# Patient Record
Sex: Female | Born: 1935
Health system: Southern US, Community
[De-identification: ages and names within clinical notes are randomized; demographics above are authoritative.]

## PROBLEM LIST (undated history)

## (undated) DIAGNOSIS — Z923 Personal history of irradiation: Secondary | ICD-10-CM

## (undated) DIAGNOSIS — B029 Zoster without complications: Secondary | ICD-10-CM

## (undated) DIAGNOSIS — H1851 Endothelial corneal dystrophy: Secondary | ICD-10-CM

## (undated) DIAGNOSIS — H269 Unspecified cataract: Secondary | ICD-10-CM

## (undated) DIAGNOSIS — M8580 Other specified disorders of bone density and structure, unspecified site: Secondary | ICD-10-CM

## (undated) DIAGNOSIS — H18519 Endothelial corneal dystrophy, unspecified eye: Secondary | ICD-10-CM

## (undated) DIAGNOSIS — C50919 Malignant neoplasm of unspecified site of unspecified female breast: Secondary | ICD-10-CM

## (undated) DIAGNOSIS — E079 Disorder of thyroid, unspecified: Secondary | ICD-10-CM

## (undated) HISTORY — DX: Malignant neoplasm of unspecified site of unspecified female breast: C50.919

## (undated) HISTORY — DX: Other specified disorders of bone density and structure, unspecified site: M85.80

## (undated) HISTORY — DX: Disorder of thyroid, unspecified: E07.9

## (undated) HISTORY — DX: Endothelial corneal dystrophy, unspecified eye: H18.519

## (undated) HISTORY — DX: Endothelial corneal dystrophy: H18.51

## (undated) HISTORY — DX: Unspecified cataract: H26.9

## (undated) HISTORY — DX: Zoster without complications: B02.9

---
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
## 1991-05-30 HISTORY — PX: DILATION AND CURETTAGE, DIAGNOSTIC / THERAPEUTIC: SUR384

## 1998-03-18 ENCOUNTER — Other Ambulatory Visit: Admission: RE | Admit: 1998-03-18 | Discharge: 1998-03-18 | Payer: Self-pay | Admitting: Obstetrics and Gynecology

## 2002-03-24 ENCOUNTER — Other Ambulatory Visit: Admission: RE | Admit: 2002-03-24 | Discharge: 2002-03-24 | Payer: Self-pay | Admitting: Obstetrics & Gynecology

## 2004-03-15 ENCOUNTER — Other Ambulatory Visit: Admission: RE | Admit: 2004-03-15 | Discharge: 2004-03-15 | Payer: Self-pay | Admitting: Family Medicine

## 2004-03-15 ENCOUNTER — Ambulatory Visit: Payer: Self-pay | Admitting: Family Medicine

## 2004-04-07 ENCOUNTER — Ambulatory Visit: Payer: Self-pay | Admitting: Family Medicine

## 2005-04-12 ENCOUNTER — Ambulatory Visit: Payer: Self-pay | Admitting: Family Medicine

## 2006-04-05 ENCOUNTER — Ambulatory Visit: Payer: Self-pay | Admitting: Family Medicine

## 2006-09-17 ENCOUNTER — Ambulatory Visit: Payer: Self-pay | Admitting: Family Medicine

## 2006-12-03 ENCOUNTER — Ambulatory Visit: Payer: Self-pay | Admitting: Family Medicine

## 2006-12-03 ENCOUNTER — Other Ambulatory Visit: Admission: RE | Admit: 2006-12-03 | Discharge: 2006-12-03 | Payer: Self-pay | Admitting: Family Medicine

## 2006-12-03 ENCOUNTER — Encounter: Payer: Self-pay | Admitting: Family Medicine

## 2006-12-03 LAB — CONVERTED CEMR LAB
ALT: 18 units/L (ref 0–35)
AST: 23 units/L (ref 0–37)
Albumin: 3.6 g/dL (ref 3.5–5.2)
BUN: 9 mg/dL (ref 6–23)
Basophils Absolute: 0 10*3/uL (ref 0.0–0.1)
Calcium: 9.1 mg/dL (ref 8.4–10.5)
Chloride: 111 meq/L (ref 96–112)
Eosinophils Absolute: 0.2 10*3/uL (ref 0.0–0.6)
GFR calc non Af Amer: 105 mL/min
HDL: 53.1 mg/dL (ref >39.0)
MCHC: 32.8 g/dL (ref 30.0–36.0)
MCV: 89.9 fL (ref 78.0–100.0)
Platelets: 185 10*3/uL (ref 150–400)
RBC: 4.91 M/uL (ref 3.87–5.11)
Triglycerides: 157 mg/dL — ABNORMAL HIGH (ref 0–149)
WBC: 4.8 10*3/uL (ref 4.5–10.5)

## 2006-12-25 ENCOUNTER — Ambulatory Visit: Payer: Self-pay | Admitting: Gastroenterology

## 2007-01-08 ENCOUNTER — Encounter: Payer: Self-pay | Admitting: Family Medicine

## 2007-01-08 ENCOUNTER — Encounter: Payer: Self-pay | Admitting: Gastroenterology

## 2007-01-08 ENCOUNTER — Ambulatory Visit: Payer: Self-pay | Admitting: Gastroenterology

## 2007-03-08 ENCOUNTER — Ambulatory Visit: Payer: Self-pay | Admitting: Family Medicine

## 2008-03-03 ENCOUNTER — Ambulatory Visit: Payer: Self-pay | Admitting: Family Medicine

## 2008-11-11 ENCOUNTER — Ambulatory Visit: Payer: Self-pay | Admitting: Family Medicine

## 2008-11-11 DIAGNOSIS — M25519 Pain in unspecified shoulder: Secondary | ICD-10-CM

## 2009-03-04 ENCOUNTER — Ambulatory Visit: Payer: Self-pay | Admitting: Family Medicine

## 2009-05-29 DIAGNOSIS — C50919 Malignant neoplasm of unspecified site of unspecified female breast: Secondary | ICD-10-CM

## 2009-05-29 HISTORY — DX: Malignant neoplasm of unspecified site of unspecified female breast: C50.919

## 2009-05-29 HISTORY — PX: CHOLECYSTECTOMY: SHX55

## 2009-05-29 HISTORY — PX: BREAST LUMPECTOMY: SHX2

## 2009-06-04 ENCOUNTER — Ambulatory Visit: Payer: Self-pay | Admitting: Family Medicine

## 2009-06-04 DIAGNOSIS — N6459 Other signs and symptoms in breast: Secondary | ICD-10-CM

## 2009-06-07 LAB — CONVERTED CEMR LAB
AST: 22 units/L (ref 0–37)
Albumin: 3.6 g/dL (ref 3.5–5.2)
Alkaline Phosphatase: 52 units/L (ref 39–117)
Basophils Absolute: 0 10*3/uL (ref 0.0–0.1)
Bilirubin, Direct: 0 mg/dL (ref 0.0–0.3)
Calcium: 9.2 mg/dL (ref 8.4–10.5)
Creatinine, Ser: 0.6 mg/dL (ref 0.4–1.2)
GFR calc non Af Amer: 104.17 mL/min (ref >60)
Glucose, Bld: 91 mg/dL (ref 70–99)
HDL: 69.2 mg/dL (ref >39.00)
Hemoglobin: 14.2 g/dL (ref 12.0–15.0)
Lymphocytes Relative: 34.9 % (ref 12.0–46.0)
Monocytes Relative: 9.4 % (ref 3.0–12.0)
Neutro Abs: 2 10*3/uL (ref 1.4–7.7)
Neutrophils Relative %: 50.3 % (ref 43.0–77.0)
RBC: 4.66 M/uL (ref 3.87–5.11)
RDW: 11.6 % (ref 11.5–14.6)
Sodium: 143 meq/L (ref 135–145)
Total Bilirubin: 0.6 mg/dL (ref 0.3–1.2)
Total CHOL/HDL Ratio: 3
Triglycerides: 89 mg/dL (ref 0.0–149.0)

## 2009-06-15 ENCOUNTER — Encounter: Admission: RE | Admit: 2009-06-15 | Discharge: 2009-06-15 | Payer: Self-pay | Admitting: Family Medicine

## 2009-06-18 ENCOUNTER — Encounter: Admission: RE | Admit: 2009-06-18 | Discharge: 2009-06-18 | Payer: Self-pay | Admitting: Family Medicine

## 2009-06-18 LAB — HM MAMMOGRAPHY

## 2009-06-23 ENCOUNTER — Encounter: Payer: Self-pay | Admitting: Family Medicine

## 2009-06-28 ENCOUNTER — Encounter: Admission: RE | Admit: 2009-06-28 | Discharge: 2009-06-28 | Payer: Self-pay | Admitting: Family Medicine

## 2009-07-13 ENCOUNTER — Encounter: Admission: RE | Admit: 2009-07-13 | Discharge: 2009-07-13 | Payer: Self-pay | Admitting: General Surgery

## 2009-07-16 ENCOUNTER — Ambulatory Visit (HOSPITAL_BASED_OUTPATIENT_CLINIC_OR_DEPARTMENT_OTHER): Admission: RE | Admit: 2009-07-16 | Discharge: 2009-07-16 | Payer: Self-pay | Admitting: General Surgery

## 2009-07-16 ENCOUNTER — Encounter: Admission: RE | Admit: 2009-07-16 | Discharge: 2009-07-16 | Payer: Self-pay | Admitting: General Surgery

## 2009-07-28 ENCOUNTER — Ambulatory Visit: Payer: Self-pay | Admitting: Oncology

## 2009-08-17 ENCOUNTER — Ambulatory Visit
Admission: RE | Admit: 2009-08-17 | Discharge: 2009-10-11 | Payer: Self-pay | Source: Home / Self Care | Admitting: Radiation Oncology

## 2009-09-13 ENCOUNTER — Encounter: Payer: Self-pay | Admitting: Family Medicine

## 2009-10-08 ENCOUNTER — Encounter (HOSPITAL_COMMUNITY): Admission: RE | Admit: 2009-10-08 | Discharge: 2010-01-06 | Payer: Self-pay | Admitting: Oncology

## 2009-10-14 ENCOUNTER — Ambulatory Visit: Payer: Self-pay | Admitting: Oncology

## 2010-01-12 ENCOUNTER — Encounter: Admission: RE | Admit: 2010-01-12 | Discharge: 2010-01-12 | Payer: Self-pay | Admitting: General Surgery

## 2010-01-20 ENCOUNTER — Encounter: Admission: RE | Admit: 2010-01-20 | Discharge: 2010-01-20 | Payer: Self-pay | Admitting: General Surgery

## 2010-03-03 ENCOUNTER — Ambulatory Visit: Payer: Self-pay | Admitting: Family Medicine

## 2010-03-16 ENCOUNTER — Ambulatory Visit (HOSPITAL_COMMUNITY): Admission: RE | Admit: 2010-03-16 | Discharge: 2010-03-17 | Payer: Self-pay | Admitting: General Surgery

## 2010-03-16 ENCOUNTER — Encounter (INDEPENDENT_AMBULATORY_CARE_PROVIDER_SITE_OTHER): Payer: Self-pay | Admitting: General Surgery

## 2010-04-18 ENCOUNTER — Ambulatory Visit: Payer: Self-pay | Admitting: Oncology

## 2010-04-20 LAB — CBC WITH DIFFERENTIAL/PLATELET
Basophils Absolute: 0 10*3/uL (ref 0.0–0.1)
Eosinophils Absolute: 0.3 10*3/uL (ref 0.0–0.5)
HCT: 38.3 % (ref 34.8–46.6)
HGB: 13.4 g/dL (ref 11.6–15.9)
LYMPH%: 21.6 % (ref 14.0–49.7)
MCV: 90.4 fL (ref 79.5–101.0)
MONO#: 0.4 10*3/uL (ref 0.1–0.9)
MONO%: 9.3 % (ref 0.0–14.0)
NEUT#: 2.8 10*3/uL (ref 1.5–6.5)
NEUT%: 63.2 % (ref 38.4–76.8)
Platelets: 158 10*3/uL (ref 145–400)
WBC: 4.5 10*3/uL (ref 3.9–10.3)

## 2010-04-21 LAB — VITAMIN D 25 HYDROXY (VIT D DEFICIENCY, FRACTURES): Vit D, 25-Hydroxy: 47 ng/mL (ref 30–89)

## 2010-04-21 LAB — COMPREHENSIVE METABOLIC PANEL
Alkaline Phosphatase: 61 U/L (ref 39–117)
BUN: 11 mg/dL (ref 6–23)
CO2: 25 mEq/L (ref 19–32)
Glucose, Bld: 97 mg/dL (ref 70–99)
Total Bilirubin: 0.4 mg/dL (ref 0.3–1.2)

## 2010-04-21 LAB — LACTATE DEHYDROGENASE: LDH: 173 U/L (ref 94–250)

## 2010-04-21 LAB — CANCER ANTIGEN 27.29: CA 27.29: 9 U/mL (ref 0–39)

## 2010-04-27 ENCOUNTER — Encounter: Payer: Self-pay | Admitting: Family Medicine

## 2010-06-09 ENCOUNTER — Encounter
Admission: RE | Admit: 2010-06-09 | Discharge: 2010-06-09 | Payer: Self-pay | Source: Home / Self Care | Attending: Radiation Oncology | Admitting: Radiation Oncology

## 2010-06-30 ENCOUNTER — Ambulatory Visit: Payer: Self-pay | Admitting: Radiation Oncology

## 2010-06-30 ENCOUNTER — Ambulatory Visit: Payer: Medicare Other | Attending: Radiation Oncology | Admitting: Radiation Oncology

## 2010-06-30 NOTE — Letter (Signed)
Summary: [facility]  [facility]   Imported By: Lennie Odor 05/18/2010 17:15:45  _____________________________________________________________________  External Attachment:    Type:   Image     Comment:   External Document

## 2010-06-30 NOTE — Assessment & Plan Note (Signed)
Summary: flu shot/njr   Nurse Visit   Allergies: 1)  ! Penicillin V Potassium (Penicillin V Potassium) 2)  ! Dextroamphetamine Sulfate (Dextroamphetamine Sulfate)  Orders Added: 1)  Flu Vaccine 29yrs + MEDICARE PATIENTS [Q2039] 2)  Administration Flu vaccine - MCR [G0008] Flu Vaccine Consent Questions     Do you have a history of severe allergic reactions to this vaccine? no    Any prior history of allergic reactions to egg and/or gelatin? no    Do you have a sensitivity to the preservative Thimersol? no    Do you have a past history of Guillan-Barre Syndrome? no    Do you currently have an acute febrile illness? no    Have you ever had a severe reaction to latex? no    Vaccine information given and explained to patient? yes    Are you currently pregnant? no    Lot Number:[id number]   Exp Date:11/26/2010   Site Given  Left Deltoid IM Romualdo Bolk, CMA (AAMA)  March 03, 2010 1:39 PM

## 2010-06-30 NOTE — Assessment & Plan Note (Signed)
Summary: emp/pt fasting/cjr   Vital Signs:  Patient profile:   76 year old female Height:      62 inches Weight:      140 pounds BMI:     25.70 Temp:     98.5 degrees F oral BP sitting:   120 / 84  (left arm) Cuff size:   regular  Vitals Entered By: Kern Reap CMA Duncan Dull) (June 04, 2009 9:42 AM)  Reason for Visit cpx  History of Present Illness: Jessica Ashley is a 76 year old, married female, nonsmoker, who comes in today for physical examination  She is always been in excellent, health.  She's had no chronic health problems.  She brings in records from [facility].  Her DEXA scan shows mild osteopenia.  Her vitamin D level is 17.  She was given vitamin D by the nurse practitioner.  She gets routine eye care.  Dental care does BSE monthly, but does not get annual mammography.  When questioned why she states she just doesn't feel like she needs to go.  Explained to her why annual mammography is very important.   Colonoscopy done in GI normal 2008, seasonal flu shot 2010, tetanus, 2008, Pneumovax today.  Patient declines the shingles vac.   Preventive Screening-Counseling & Management  Alcohol-Tobacco     Smoking Status: never  Caffeine-Diet-Exercise     Does Patient Exercise: yes      Drug Use:  no.    Allergies: 1)  ! Penicillin V Potassium (Penicillin V Potassium) 2)  ! Dextroamphetamine Sulfate (Dextroamphetamine Sulfate)  Past History:  Past medical, surgical, family and social histories (including risk factors) reviewed, and no changes noted (except as noted below).  Family History: Reviewed history and no changes required.  Social History: Reviewed history and no changes required. Married Never Smoked Alcohol use-no Drug use-no Regular exercise-yes Smoking Status:  never Drug Use:  no Does Patient Exercise:  yes  Review of Systems      See HPI  Physical Exam  General:  Well-developed,well-nourished,in no acute distress; alert,appropriate and  cooperative throughout examination Head:  Normocephalic and atraumatic without obvious abnormalities. No apparent alopecia or balding. Eyes:  No corneal or conjunctival inflammation noted. EOMI. Perrla. Funduscopic exam benign, without hemorrhages, exudates or papilledema. Vision grossly normal. Ears:  External ear exam shows no significant lesions or deformities.  Otoscopic examination reveals clear canals, tympanic membranes are intact bilaterally without bulging, retraction, inflammation or discharge. Hearing is grossly normal bilaterally. Nose:  External nasal examination shows no deformity or inflammation. Nasal mucosa are pink and moist without lesions or exudates. Mouth:  Oral mucosa and oropharynx without lesions or exudates.  Teeth in good repair. Neck:  No deformities, masses, or tenderness noted. Chest Wall:  No deformities, masses, or tenderness noted. Breasts:  On inspection.  Both breasts appear normal except the left nipple is retracted in the supine position.  In the upright position but assumes a normal outward appearance.  She, states she's noticed this for a long time, nothing new.  No palpable masses Lungs:  Normal respiratory effort, chest expands symmetrically. Lungs are clear to auscultation, no crackles or wheezes. Heart:  Normal rate and regular rhythm. S1 and S2 normal without gallop, murmur, click, rub or other extra sounds. Abdomen:  Bowel sounds positive,abdomen soft and non-tender without masses, organomegaly or hernias noted. Msk:  No deformity or scoliosis noted of thoracic or lumbar spine.   Pulses:  R and L carotid,radial,femoral,dorsalis pedis and posterior tibial pulses are full and  equal bilaterally Extremities:  No clubbing, cyanosis, edema, or deformity noted with normal full range of motion of all joints.   Neurologic:  No cranial nerve deficits noted. Station and gait are normal. Plantar reflexes are down-going bilaterally. DTRs are symmetrical throughout.  Sensory, motor and coordinative functions appear intact. Skin:  Intact without suspicious lesions or rashes Cervical Nodes:  No lymphadenopathy noted Axillary Nodes:  No palpable lymphadenopathy Inguinal Nodes:  No significant adenopathy Psych:  Cognition and judgment appear intact. Alert and cooperative with normal attention span and concentration. No apparent delusions, illusions, hallucinations   Impression & Recommendations:  Problem # 1:  OTHER SIGN AND SYMPTOM IN BREAST (ICD-611.79) Assessment New  Orders: Venipuncture (16109) Prescription Created Electronically [phone]) UA Dipstick w/o Micro (automated)  (81003) TLB-Lipid Panel (80061-LIPID) TLB-BMP (Basic Metabolic Panel-BMET) (80048-METABOL) TLB-CBC Platelet - w/Differential (85025-CBCD) TLB-Hepatic/Liver Function Pnl (80076-HEPATIC) TLB-TSH (Thyroid Stimulating Hormone) (84443-TSH)  Problem # 2:  Preventive Health Care (ICD-V70.0) Assessment: Unchanged  Complete Medication List: 1)  Calcium, Mg, D3   Other Orders: EKG w/ Interpretation (93000) Pneumococcal Vaccine (09811) Admin 1st Vaccine (91478)  Patient Instructions: 1)  called the breast Center today and get set up for your mammogram next week. 2)  Continue to do a thorough breast exam monthly and I would recommend annual mammography 3)  Please schedule a follow-up appointment in 1 year. 4)  It is important that you exercise regularly at least 20 minutes 5 times a week. If you develop chest pain, have severe difficulty breathing, or feel very tired , stop exercising immediately and seek medical attention. 5)  Schedule your mammogram. 6)  Schedule a colonoscopy/sigmoidoscopy to help detect colon cancer. 7)  Take an Aspirin every day.   Immunization History:  Tetanus/Td Immunization History:    Tetanus/Td:  historical (05/29/2006)  Immunizations Administered:  Pneumonia Vaccine:    Vaccine Type: Pneumovax    Site: right deltoid    Mfr: Merck    Dose:  0.5 ml    Route: IM    Given by: Kern Reap CMA (AAMA)    Exp. Date: 06/24/2010    Lot #: [id number]    Physician counseled: yes

## 2010-07-22 ENCOUNTER — Ambulatory Visit: Payer: Self-pay | Admitting: Family Medicine

## 2010-08-10 LAB — COMPREHENSIVE METABOLIC PANEL
ALT: 17 U/L (ref 0–35)
AST: 21 U/L (ref 0–37)
Albumin: 3.5 g/dL (ref 3.5–5.2)
CO2: 31 mEq/L (ref 19–32)
Calcium: 9.3 mg/dL (ref 8.4–10.5)
Chloride: 105 mEq/L (ref 96–112)
Creatinine, Ser: 0.65 mg/dL (ref 0.4–1.2)
GFR calc Af Amer: >60 mL/min (ref >60)
GFR calc non Af Amer: >60 mL/min (ref >60)
Sodium: 142 mEq/L (ref 135–145)

## 2010-08-10 LAB — URINALYSIS, ROUTINE W REFLEX MICROSCOPIC
Bilirubin Urine: NEGATIVE
Glucose, UA: NEGATIVE mg/dL
Hgb urine dipstick: NEGATIVE
Ketones, ur: NEGATIVE mg/dL
Protein, ur: NEGATIVE mg/dL
pH: 8 (ref 5.0–8.0)

## 2010-08-10 LAB — CBC
Hemoglobin: 14.2 g/dL (ref 12.0–15.0)
MCH: 31.4 pg (ref 26.0–34.0)
MCHC: 34.6 g/dL (ref 30.0–36.0)
Platelets: 141 10*3/uL — ABNORMAL LOW (ref 150–400)
RBC: 4.52 MIL/uL (ref 3.87–5.11)

## 2010-08-10 LAB — DIFFERENTIAL
Eosinophils Absolute: 0.2 10*3/uL (ref 0.0–0.7)
Eosinophils Relative: 4 % (ref 0–5)
Lymphocytes Relative: 23 % (ref 12–46)
Lymphs Abs: 1 10*3/uL (ref 0.7–4.0)
Monocytes Absolute: 0.5 10*3/uL (ref 0.1–1.0)
Monocytes Relative: 11 % (ref 3–12)

## 2010-08-15 ENCOUNTER — Encounter: Payer: Self-pay | Admitting: Family Medicine

## 2010-08-16 ENCOUNTER — Encounter: Payer: Self-pay | Admitting: Family Medicine

## 2010-08-16 ENCOUNTER — Ambulatory Visit (INDEPENDENT_AMBULATORY_CARE_PROVIDER_SITE_OTHER): Payer: Medicare Other | Admitting: Family Medicine

## 2010-08-16 VITALS — BP 122/72 | HR 80 | Temp 98.4°F | Resp 12 | Ht 59.0 in | Wt 141.0 lb

## 2010-08-16 DIAGNOSIS — Z136 Encounter for screening for cardiovascular disorders: Secondary | ICD-10-CM

## 2010-08-16 DIAGNOSIS — C50912 Malignant neoplasm of unspecified site of left female breast: Secondary | ICD-10-CM | POA: Insufficient documentation

## 2010-08-16 DIAGNOSIS — E049 Nontoxic goiter, unspecified: Secondary | ICD-10-CM

## 2010-08-16 DIAGNOSIS — C50919 Malignant neoplasm of unspecified site of unspecified female breast: Secondary | ICD-10-CM

## 2010-08-16 DIAGNOSIS — Z0000 Encounter for general adult medical examination without abnormal findings: Secondary | ICD-10-CM

## 2010-08-16 LAB — POCT URINALYSIS DIPSTICK
Blood, UA: NEGATIVE
Nitrite, UA: NEGATIVE
Protein, UA: NEGATIVE
Spec Grav, UA: 1.02
Urobilinogen, UA: 0.2

## 2010-08-16 NOTE — Progress Notes (Signed)
  Subjective:    Patient ID: Jessica Ashley, female    DOB: 1934/10/30, 76 y.o.   MRN: [id number]  HPI Jessica Ashley is a delightful, 76 year old, married female, nonsmoker, who comes in today for general physical examination  Last year.  She had a mammogram, which was abnormal.  There was a lesion in her left breast at the 9 o'clock position.  Biopsy turned out cancer.  She underwent a surgical excision with postop radiation.  She is currently followed by Dr. Donnie Coffin.  Every 6 months in the cancer Center.  She is doing well.  No recurrence.       Review of Systems  Constitutional: Negative.   HENT: Negative.   Eyes: Negative.   Respiratory: Negative.   Cardiovascular: Negative.   Gastrointestinal: Negative.   Genitourinary: Negative.   Musculoskeletal: Negative.   Neurological: Negative.   Hematological: Negative.   Psychiatric/Behavioral: Negative.        Objective:   Physical Exam  Constitutional: She appears well-developed and well-nourished.  HENT:  Head: Normocephalic and atraumatic.  Right Ear: External ear normal.  Left Ear: External ear normal.  Nose: Nose normal.  Mouth/Throat: Oropharynx is clear and moist.  Eyes: EOM are normal. Pupils are equal, round, and reactive to light.  Neck: Normal range of motion. Neck supple. No thyromegaly present.  Cardiovascular: Normal rate, regular rhythm, normal heart sounds and intact distal pulses.  Exam reveals no gallop and no friction rub.   No murmur heard. Pulmonary/Chest: Effort normal and breath sounds normal.       Right breast normal left breast scar at the 9 o'clock position.  There is still some changes in that left breast from radiation treatment.  Otherwise, no palpable masses.  Scar in the axilla from lymph node biopsy  Abdominal: Soft. Bowel sounds are normal. She exhibits no distension and no mass. There is no tenderness. There is no rebound.       Scars in the abdomen from cholecystectomy.  Also, recent    Musculoskeletal: Normal range of motion.  Lymphadenopathy:    She has no cervical adenopathy.  Neurological: She is alert. She has normal reflexes. No cranial nerve deficit. She exhibits normal muscle tone. Coordination normal.  Skin: Skin is warm and dry.  Psychiatric: She has a normal mood and affect. Her behavior is normal. Judgment and thought content normal.          Assessment & Plan:  Status-post breast cancer........... Continue medication and follow-up with oncology.  Status post gallbladder removal.  Annual follow-up

## 2010-08-16 NOTE — Patient Instructions (Signed)
Continue your current medications from oncology.  Follow-up in one year, sooner if any problems

## 2010-08-18 LAB — CBC
Hemoglobin: 14.6 g/dL (ref 12.0–15.0)
RBC: 4.74 MIL/uL (ref 3.87–5.11)
RDW: 12.3 % (ref 11.5–15.5)
WBC: 4.5 10*3/uL (ref 4.0–10.5)

## 2010-08-18 LAB — COMPREHENSIVE METABOLIC PANEL
ALT: 19 U/L (ref 0–35)
Albumin: 3.6 g/dL (ref 3.5–5.2)
Alkaline Phosphatase: 59 U/L (ref 39–117)
Chloride: 103 mEq/L (ref 96–112)
Glucose, Bld: 87 mg/dL (ref 70–99)
Potassium: 4.7 mEq/L (ref 3.5–5.1)
Sodium: 140 mEq/L (ref 135–145)
Total Protein: 7.1 g/dL (ref 6.0–8.3)

## 2010-08-18 LAB — URINALYSIS, ROUTINE W REFLEX MICROSCOPIC
Bilirubin Urine: NEGATIVE
Glucose, UA: NEGATIVE mg/dL
Ketones, ur: NEGATIVE mg/dL
Protein, ur: NEGATIVE mg/dL
pH: 7 (ref 5.0–8.0)

## 2010-08-18 LAB — DIFFERENTIAL
Basophils Relative: 0 % (ref 0–1)
Eosinophils Absolute: 0.2 10*3/uL (ref 0.0–0.7)
Monocytes Absolute: 0.5 10*3/uL (ref 0.1–1.0)
Monocytes Relative: 10 % (ref 3–12)
Neutrophils Relative %: 51 % (ref 43–77)

## 2010-08-18 LAB — CANCER ANTIGEN 27.29: CA 27.29: 14 U/mL (ref 0–39)

## 2010-09-07 IMAGING — CR DG CHEST 2V
2 series · 2 of 2 positions shown · non-contrast
Comparison: None.

CLINICAL DATA: Preop left breast cancer.

CHEST - 2 VIEW

[w chest pa]
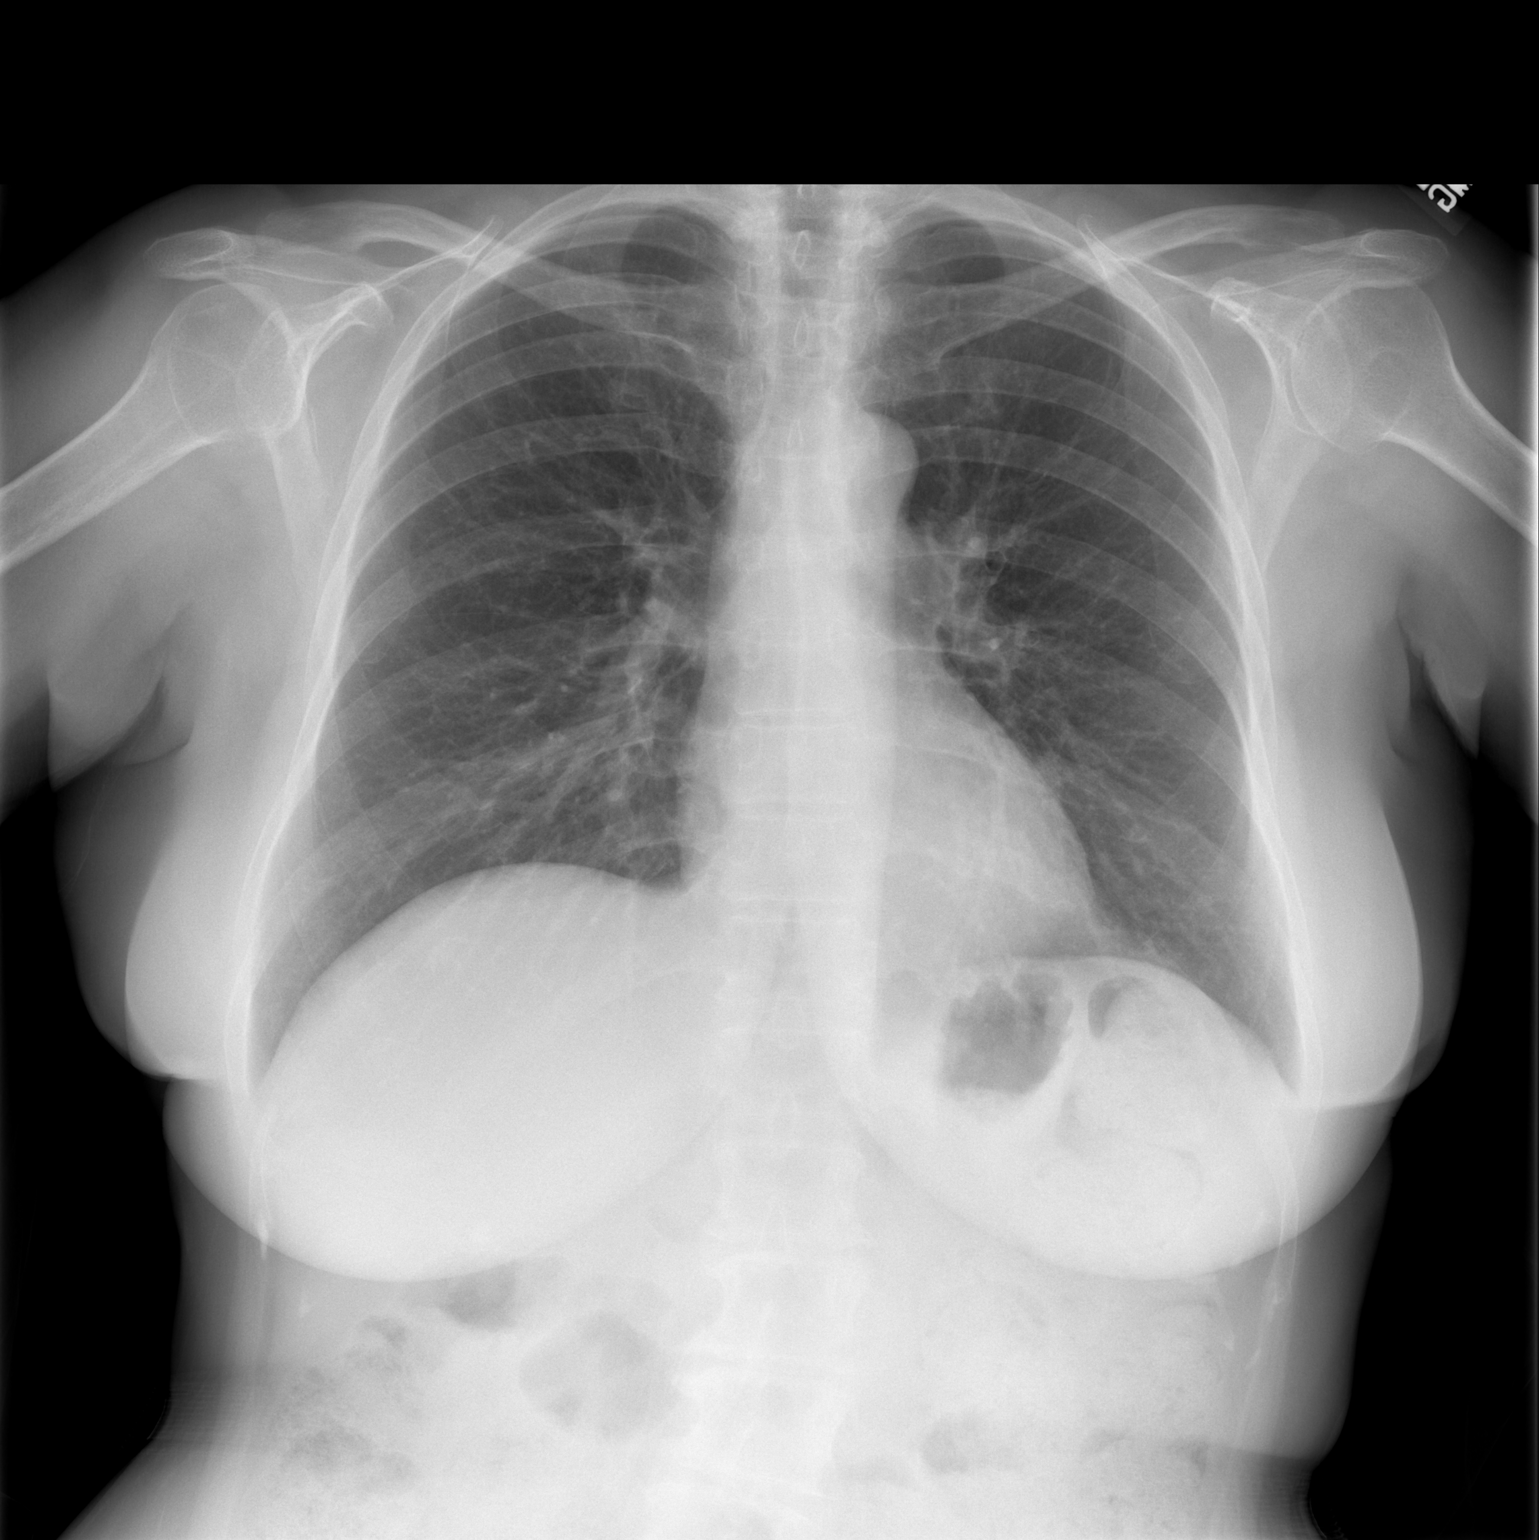

[w chest lat]
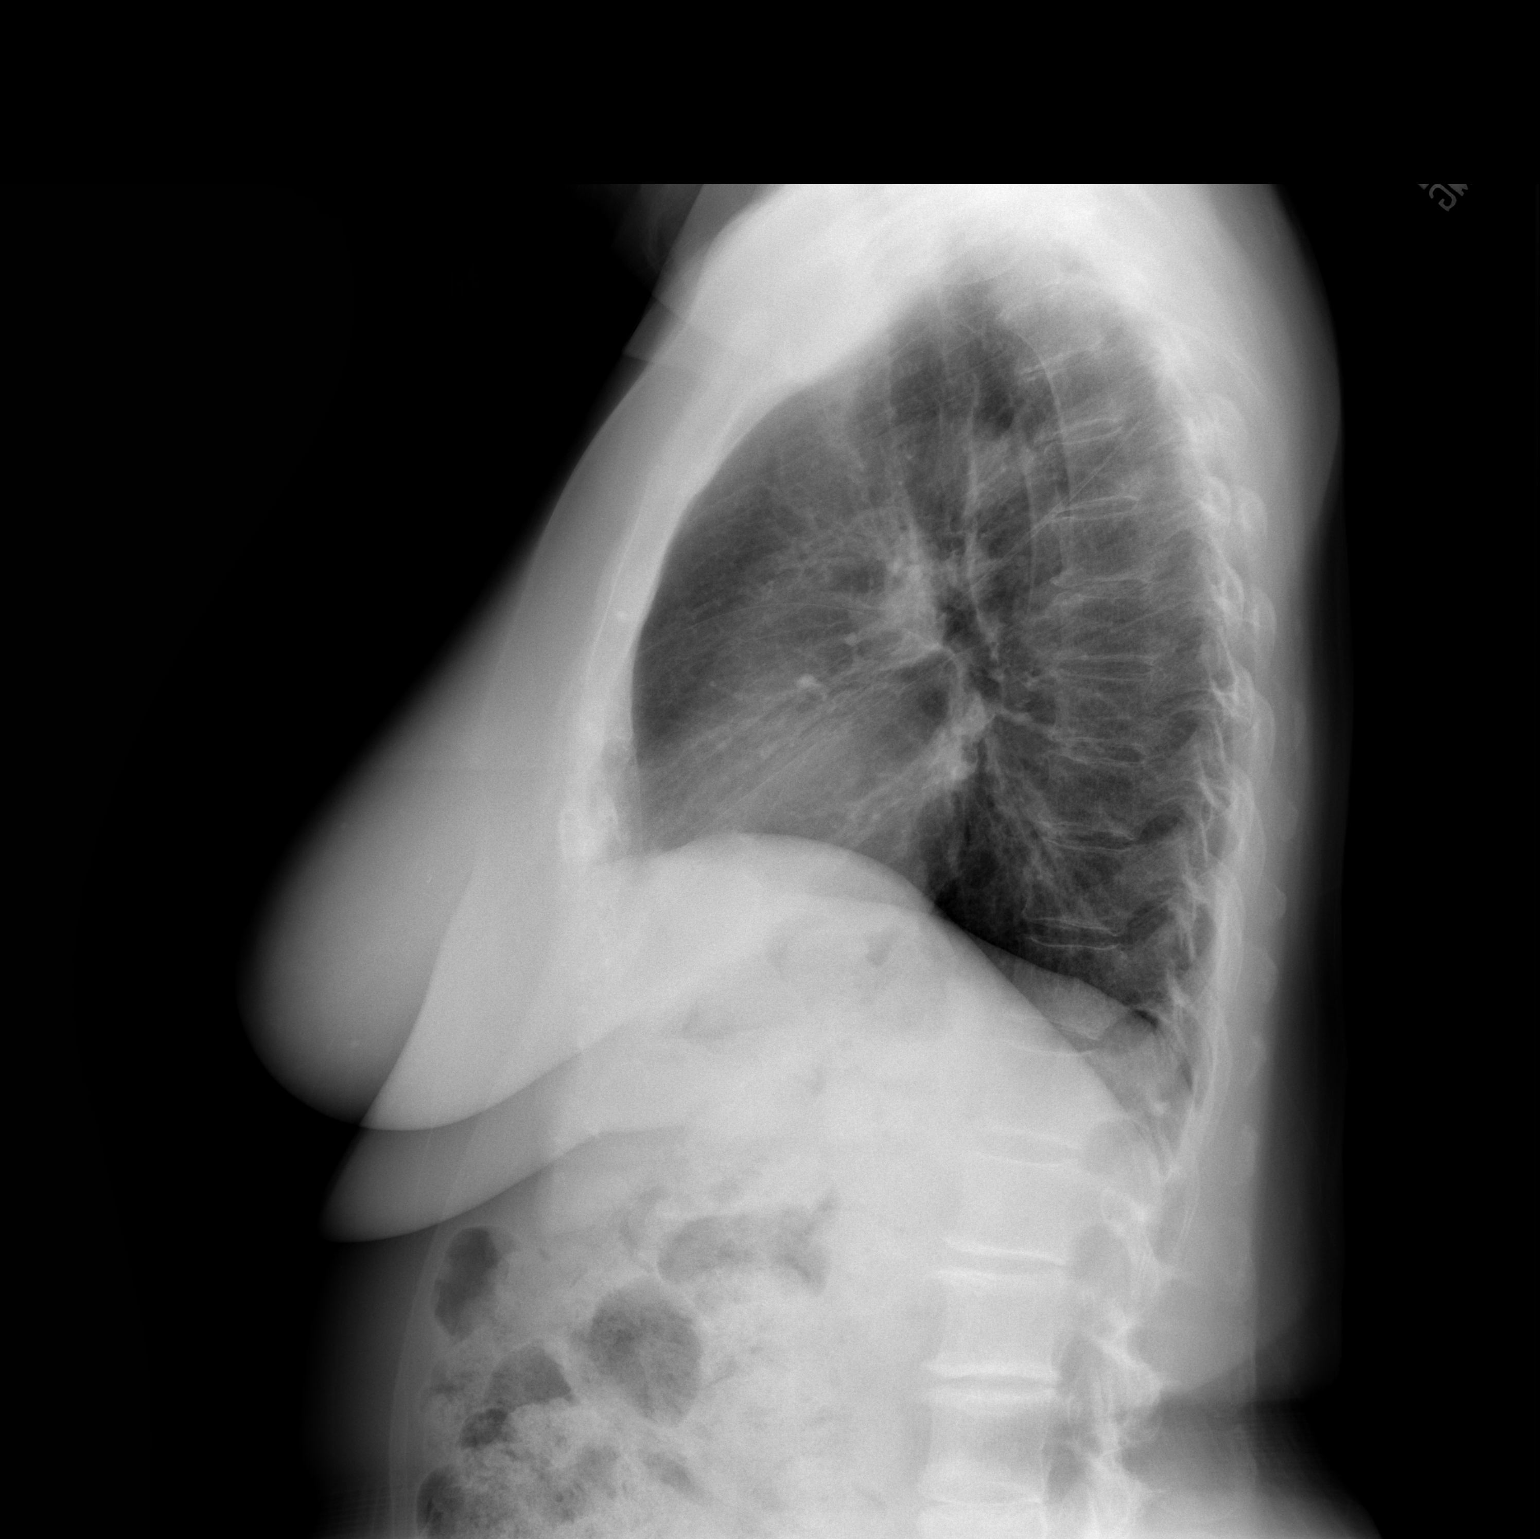

[2 of 2 positions shown; findings below may reference images not displayed]

FINDINGS: Generalized prominence bronchopulmonary markings of
slight asthma or bronchitis seen.  Lungs are otherwise clear.
Heart size is normal.  Mediastinum, hila, pleura and slight
degenerative changes in spine are otherwise unremarkable.
IMPRESSION: 1.  Slight perihilar asthma or bronchitis.
2.  No acute or metastatic disease findings.

## 2010-10-03 ENCOUNTER — Encounter (INDEPENDENT_AMBULATORY_CARE_PROVIDER_SITE_OTHER): Payer: Self-pay | Admitting: General Surgery

## 2010-10-18 ENCOUNTER — Encounter: Payer: Medicare Other | Admitting: Oncology

## 2010-10-18 ENCOUNTER — Other Ambulatory Visit: Payer: Self-pay | Admitting: Oncology

## 2010-10-18 LAB — CBC WITH DIFFERENTIAL/PLATELET
BASO%: 0.4 % (ref 0.0–2.0)
LYMPH%: 27.9 % (ref 14.0–49.7)
MCHC: 34.4 g/dL (ref 31.5–36.0)
MCV: 90.4 fL (ref 79.5–101.0)
MONO%: 11.9 % (ref 0.0–14.0)
Platelets: 144 10*3/uL — ABNORMAL LOW (ref 145–400)
RBC: 4.38 10*6/uL (ref 3.70–5.45)

## 2010-10-18 LAB — COMPREHENSIVE METABOLIC PANEL
ALT: 16 U/L (ref 0–35)
Alkaline Phosphatase: 70 U/L (ref 39–117)
Sodium: 143 mEq/L (ref 135–145)
Total Bilirubin: 0.4 mg/dL (ref 0.3–1.2)
Total Protein: 6.4 g/dL (ref 6.0–8.3)

## 2010-10-18 LAB — VITAMIN D 25 HYDROXY (VIT D DEFICIENCY, FRACTURES): Vit D, 25-Hydroxy: 55 ng/mL (ref 30–89)

## 2010-10-25 ENCOUNTER — Other Ambulatory Visit: Payer: Self-pay | Admitting: Oncology

## 2010-10-25 ENCOUNTER — Encounter (HOSPITAL_BASED_OUTPATIENT_CLINIC_OR_DEPARTMENT_OTHER): Payer: Medicare Other | Admitting: Oncology

## 2010-10-25 DIAGNOSIS — C50919 Malignant neoplasm of unspecified site of unspecified female breast: Secondary | ICD-10-CM

## 2010-10-25 DIAGNOSIS — Z853 Personal history of malignant neoplasm of breast: Secondary | ICD-10-CM

## 2010-10-25 DIAGNOSIS — Z170 Estrogen receptor positive status [ER+]: Secondary | ICD-10-CM

## 2010-10-25 DIAGNOSIS — Z9889 Other specified postprocedural states: Secondary | ICD-10-CM

## 2010-10-26 ENCOUNTER — Other Ambulatory Visit: Payer: Self-pay | Admitting: Oncology

## 2010-10-26 DIAGNOSIS — C50919 Malignant neoplasm of unspecified site of unspecified female breast: Secondary | ICD-10-CM

## 2011-01-06 ENCOUNTER — Telehealth: Payer: Self-pay | Admitting: Family Medicine

## 2011-01-06 NOTE — Telephone Encounter (Signed)
Pt informed and she voiced her understanding 

## 2011-01-06 NOTE — Telephone Encounter (Signed)
Pt called and said that she has some kind of bite on her arm, below elbow. This happened yesterday morning.  Area is swelling. Pt feels shaky inside. There were several small bites around larger one. Area is inflamed and warm to touch. Pls advise what pt should do?

## 2011-01-06 NOTE — Telephone Encounter (Signed)
Pt is feeling some better, but arm is swollen and looks like poison ivy or an insect bite?  She will decide whether to go to Urgent Care today, or call the [facility] in the am.  Is taking Benadryl.

## 2011-01-06 NOTE — Telephone Encounter (Signed)
If pt has and fever or pain she should promptly be seen.  Another option is [facility] unless symptoms are rapidly progressing.

## 2011-03-01 ENCOUNTER — Ambulatory Visit (INDEPENDENT_AMBULATORY_CARE_PROVIDER_SITE_OTHER): Payer: Medicare Other

## 2011-03-01 DIAGNOSIS — Z23 Encounter for immunization: Secondary | ICD-10-CM

## 2011-04-03 ENCOUNTER — Ambulatory Visit
Admission: RE | Admit: 2011-04-03 | Discharge: 2011-04-03 | Disposition: A | Discharge status: Home / Self Care | Payer: Medicare Other | Source: Ambulatory Visit | Attending: Oncology | Admitting: Oncology

## 2011-04-03 ENCOUNTER — Other Ambulatory Visit: Payer: Self-pay | Admitting: Oncology

## 2011-04-03 DIAGNOSIS — C50919 Malignant neoplasm of unspecified site of unspecified female breast: Secondary | ICD-10-CM

## 2011-04-10 ENCOUNTER — Ambulatory Visit
Admission: RE | Admit: 2011-04-10 | Discharge: 2011-04-10 | Disposition: A | Discharge status: Home / Self Care | Payer: Medicare Other | Source: Ambulatory Visit | Attending: Oncology | Admitting: Oncology

## 2011-04-10 DIAGNOSIS — C50919 Malignant neoplasm of unspecified site of unspecified female breast: Secondary | ICD-10-CM

## 2011-04-27 ENCOUNTER — Telehealth: Payer: Self-pay | Admitting: *Deleted

## 2011-04-27 NOTE — Telephone Encounter (Signed)
patient's husband confirmed over the phone about the new date and time on 05-31-2011 

## 2011-05-10 ENCOUNTER — Telehealth: Payer: Self-pay | Admitting: *Deleted

## 2011-05-10 NOTE — Telephone Encounter (Signed)
Pt reports that she is experiencing diarrhea when she takes exemestene. Pt would like to d/c medication until appointment in January

## 2011-05-11 IMAGING — RF DG CHOLANGIOGRAM OPERATIVE
1 series · 4 of 4 positions shown · non-contrast
Comparison: 01/20/2010

CLINICAL DATA: Cholelithiasis - cholecystectomy.

INTRAOPERATIVE CHOLANGIOGRAM
TECHNIQUE: Cholangiographic images from the C-arm fluoroscopic
device were submitted for interpretation post-operatively.  Please
see the procedural report for the amount of contrast and the
fluoroscopy time utilized.

[Series 1: run · 4 of 87 frames shown]
[frame 14/87]
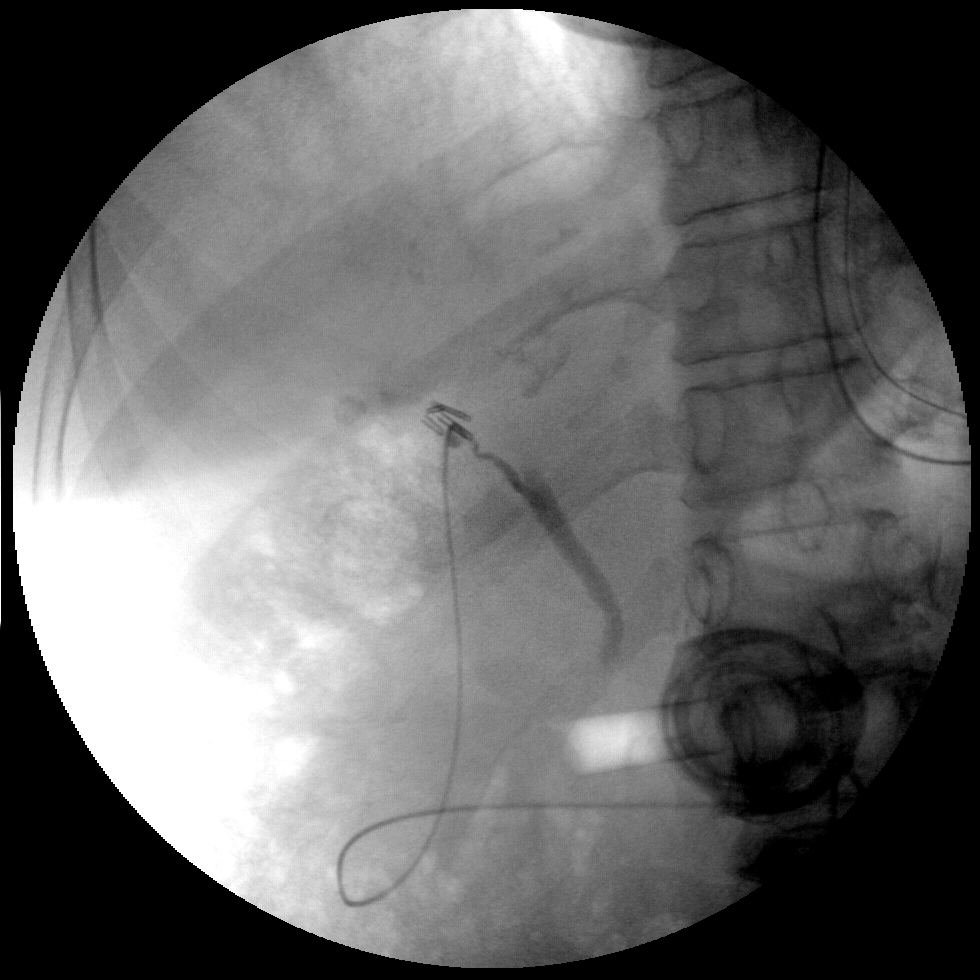
[frame 44/87]
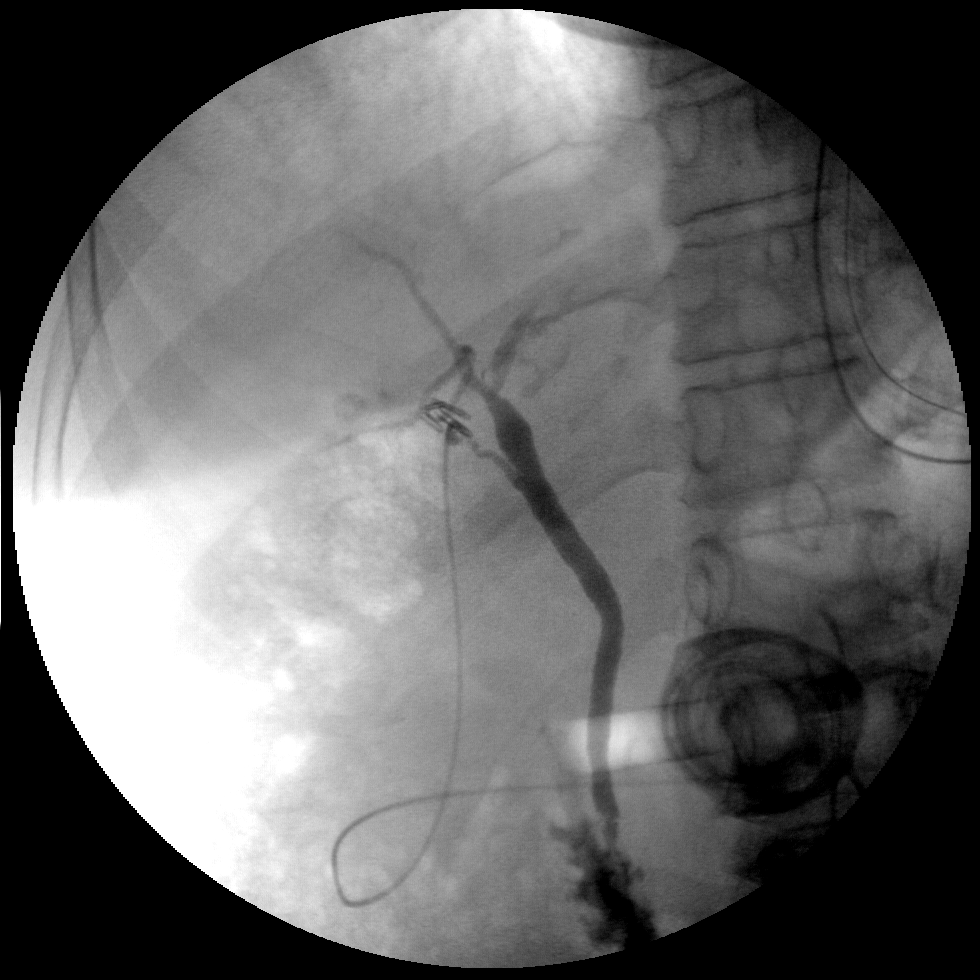
[frame 74/87]
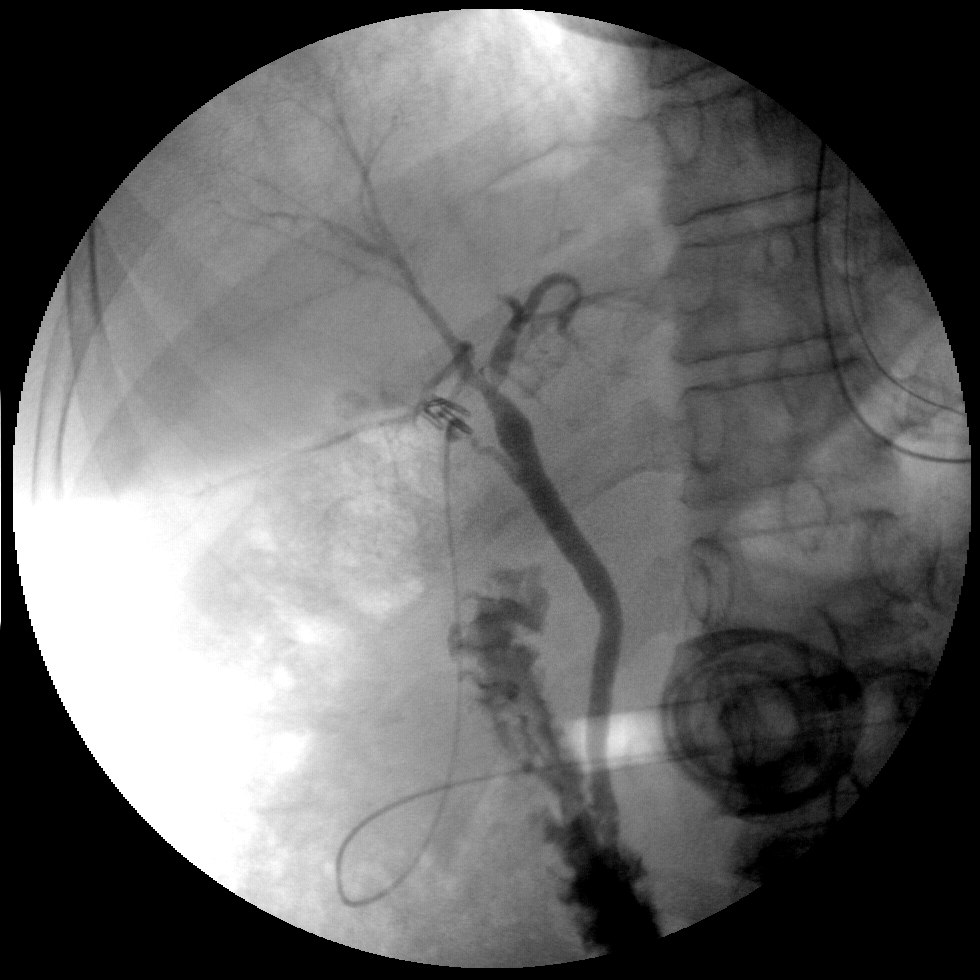
[frame 77/87]
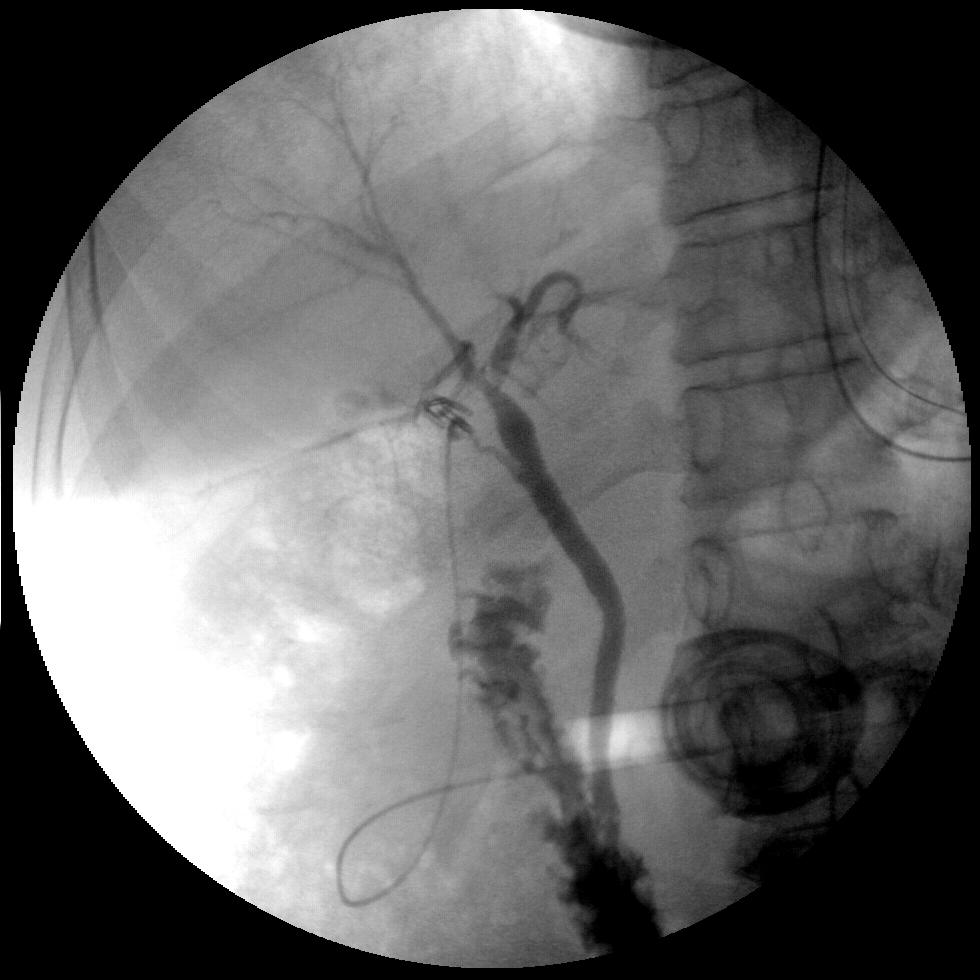

[4 of 4 positions shown; findings below may reference images not displayed]

FINDINGS: Injection of the cystic duct demonstrates normal caliber
CBD and visualized intrahepatic biliary system.
No filling defects are identified within the biliary system to
suggest choledocholelithiasis.
There is a minimal area of irregularity/narrowing at the distal CBD
and a mild stricture is not entirely excluded.
Contrast flows freely into the duodenum.
IMPRESSION: No evidence of choledocholithiasis or biliary dilatation.

Minimal irregularity/narrowing at the distal CBD - this probably is
normal but a mild stricture is not entirely excluded.  Consider
further evaluation of normal.

## 2011-05-19 ENCOUNTER — Other Ambulatory Visit: Payer: Self-pay | Admitting: Oncology

## 2011-05-19 ENCOUNTER — Other Ambulatory Visit (HOSPITAL_BASED_OUTPATIENT_CLINIC_OR_DEPARTMENT_OTHER): Payer: BLUE CROSS/BLUE SHIELD | Admitting: Lab

## 2011-05-19 DIAGNOSIS — C50419 Malignant neoplasm of upper-outer quadrant of unspecified female breast: Secondary | ICD-10-CM

## 2011-05-19 DIAGNOSIS — Z170 Estrogen receptor positive status [ER+]: Secondary | ICD-10-CM

## 2011-05-19 LAB — CBC WITH DIFFERENTIAL/PLATELET
Basophils Absolute: 0 10*3/uL (ref 0.0–0.1)
EOS%: 4.1 % (ref 0.0–7.0)
Eosinophils Absolute: 0.2 10*3/uL (ref 0.0–0.5)
HCT: 41.4 % (ref 34.8–46.6)
HGB: 14.1 g/dL (ref 11.6–15.9)
MCH: 30.7 pg (ref 25.1–34.0)
MCV: 89.9 fL (ref 79.5–101.0)
NEUT%: 60.1 % (ref 38.4–76.8)
lymph#: 1.1 10*3/uL (ref 0.9–3.3)

## 2011-05-20 LAB — COMPREHENSIVE METABOLIC PANEL
Albumin: 4.2 g/dL (ref 3.5–5.2)
Alkaline Phosphatase: 60 U/L (ref 39–117)
BUN: 15 mg/dL (ref 6–23)
CO2: 24 mEq/L (ref 19–32)
Calcium: 9.5 mg/dL (ref 8.4–10.5)
Glucose, Bld: 81 mg/dL (ref 70–99)
Potassium: 4.3 mEq/L (ref 3.5–5.3)

## 2011-05-20 LAB — CANCER ANTIGEN 27.29: CA 27.29: 15 U/mL (ref 0–39)

## 2011-05-20 LAB — VITAMIN D 25 HYDROXY (VIT D DEFICIENCY, FRACTURES): Vit D, 25-Hydroxy: 63 ng/mL (ref 30–89)

## 2011-05-26 ENCOUNTER — Ambulatory Visit: Payer: Medicare Other | Admitting: Oncology

## 2011-05-31 ENCOUNTER — Ambulatory Visit (HOSPITAL_BASED_OUTPATIENT_CLINIC_OR_DEPARTMENT_OTHER): Payer: BLUE CROSS/BLUE SHIELD | Admitting: Oncology

## 2011-05-31 VITALS — BP 123/72 | HR 85 | Temp 98.2°F | Ht 59.0 in | Wt 135.2 lb

## 2011-05-31 DIAGNOSIS — C50919 Malignant neoplasm of unspecified site of unspecified female breast: Secondary | ICD-10-CM

## 2011-05-31 DIAGNOSIS — E559 Vitamin D deficiency, unspecified: Secondary | ICD-10-CM | POA: Diagnosis not present

## 2011-05-31 MED ORDER — TAMOXIFEN CITRATE 20 MG PO TABS: 20.0000 mg | ORAL_TABLET | Freq: Every day | ORAL | Status: DC

## 2011-05-31 NOTE — Progress Notes (Signed)
Hematology and Oncology Follow Up Visit  Jessica Ashley [id number] 1934/08/31 77 y.o. 05/31/2011 3:37 PM PCP dr.jeffery todd  Principle Diagnosis: :  History of node negative breast cancer status post lumpectomy 07/16/2009, status post radiation therapy completed 10/11/2009 on adjuvant hormonal therapy, previously on arimidex, letrozole and most recently aromasin.  Interim History:  There have been no intercurrent illness, hospitalizations or medication changes. Stopped aromasin on 12/8, secondary to diarrhea which then resolved.  Medications: I have reviewed the patient's current medications.  Allergies:  Allergies  Allergen Reactions  . Dextroamphetamine Sulfate   . Penicillins   . Sulfur     Past Medical History, Surgical history, Social history, and Family History were reviewed and updated.  Review of Systems: Constitutional:  Negative for fever, chills, night sweats, anorexia, weight loss, pain. Cardiovascular: negative Respiratory: negative Neurological: negative Dermatological: negative ENT: negative Skin Gastrointestinal: negative Genito-Urinary: negative Hematological and Lymphatic: negative Breast: negative Musculoskeletal: negative Remaining ROS negative.  Physical Exam: Blood pressure 123/72, pulse 85, temperature 98.2 F (36.8 C), temperature source Oral, height 4\' 11"  (1.499 m), weight 135 lb 3.2 oz (61.326 kg). ECOG: 0 General appearance: alert, cooperative and appears stated age Head: Normocephalic, without obvious abnormality, atraumatic Neck: no adenopathy, no carotid bruit, no JVD, supple, symmetrical, trachea midline and thyroid not enlarged, symmetric, no tenderness/mass/nodules Lymph nodes: Cervical, supraclavicular, and axillary nodes normal. Cardiac : regular rate and rhythm, no murmurs or gallops Pulmonary:clear to auscultation bilaterally and normal percussion bilaterally Breasts: inspection negative, no nipple discharge or bleeding, no masses  or nodularity palpable Abdomen:soft, non-tender; bowel sounds normal; no masses,  no organomegaly Extremities negative Neuro: alert, oriented, normal speech, no focal findings or movement disorder noted  Lab Results: Lab Results  Component Value Date   WBC 4.7 05/19/2011   HGB 14.1 05/19/2011   HCT 41.4 05/19/2011   MCV 89.9 05/19/2011   PLT 144* 05/19/2011     Chemistry      Component Value Date/Time   NA 142 05/19/2011 1337   NA 142 05/19/2011 1337   NA 142 05/19/2011 1337   NA 142 05/19/2011 1337   K 4.3 05/19/2011 1337   K 4.3 05/19/2011 1337   K 4.3 05/19/2011 1337   K 4.3 05/19/2011 1337   CL 105 05/19/2011 1337   CL 105 05/19/2011 1337   CL 105 05/19/2011 1337   CL 105 05/19/2011 1337   CO2 24 05/19/2011 1337   CO2 24 05/19/2011 1337   CO2 24 05/19/2011 1337   CO2 24 05/19/2011 1337   BUN 15 05/19/2011 1337   BUN 15 05/19/2011 1337   BUN 15 05/19/2011 1337   BUN 15 05/19/2011 1337   CREATININE 0.82 05/19/2011 1337   CREATININE 0.82 05/19/2011 1337   CREATININE 0.82 05/19/2011 1337   CREATININE 0.82 05/19/2011 1337      Component Value Date/Time   CALCIUM 9.5 05/19/2011 1337   CALCIUM 9.5 05/19/2011 1337   CALCIUM 9.5 05/19/2011 1337   CALCIUM 9.5 05/19/2011 1337   ALKPHOS 60 05/19/2011 1337   ALKPHOS 60 05/19/2011 1337   ALKPHOS 60 05/19/2011 1337   ALKPHOS 60 05/19/2011 1337   AST 20 05/19/2011 1337   AST 20 05/19/2011 1337   AST 20 05/19/2011 1337   AST 20 05/19/2011 1337   ALT 12 05/19/2011 1337   ALT 12 05/19/2011 1337   ALT 12 05/19/2011 1337   ALT 12 05/19/2011 1337   BILITOT 0.6 05/19/2011 1337   BILITOT 0.6 05/19/2011 1337  BILITOT 0.6 05/19/2011 1337   BILITOT 0.6 05/19/2011 1337      .pathology. Radiological Studies: chest X-ray n/a Mammogram Due 1/13 Bone density n/a  Impression and Plan: Pt is doing well but doesn't tolerate AI. Will change to tamoxifen; discussed s/e.. F/u 6 mon   More than 50% of the visit was spent  in patient-related counselling   Pierce Crane, MD 1/2/20133:37 PM

## 2011-06-16 ENCOUNTER — Ambulatory Visit
Admission: RE | Admit: 2011-06-16 | Discharge: 2011-06-16 | Disposition: A | Discharge status: Home / Self Care | Payer: Medicare Other | Source: Ambulatory Visit | Attending: Oncology | Admitting: Oncology

## 2011-06-16 DIAGNOSIS — Z853 Personal history of malignant neoplasm of breast: Secondary | ICD-10-CM

## 2011-07-12 ENCOUNTER — Ambulatory Visit (INDEPENDENT_AMBULATORY_CARE_PROVIDER_SITE_OTHER): Payer: Medicare Other | Admitting: General Surgery

## 2011-07-12 ENCOUNTER — Encounter (INDEPENDENT_AMBULATORY_CARE_PROVIDER_SITE_OTHER): Payer: Self-pay | Admitting: General Surgery

## 2011-07-12 VITALS — BP 118/76 | HR 62 | Temp 97.8°F | Resp 18 | Ht 59.0 in | Wt 134.2 lb

## 2011-07-12 DIAGNOSIS — Z853 Personal history of malignant neoplasm of breast: Secondary | ICD-10-CM

## 2011-07-12 NOTE — Patient Instructions (Signed)
Your physical exam today is normal. Your recent mammograms are normal. There is no evidence of cancer.  You should have bilateral mammograms in one year. Return to see me in one year.

## 2011-07-12 NOTE — Progress Notes (Signed)
Subjective:     Patient ID: Jessica Ashley, female   DOB: 04/23/1935, 77 y.o.   MRN: [id number]  HPI This 77 year old woman returns for long-term followup regarding her left breast cancer.  She underwent left partial mastectomy and sentinel node biopsy on July 16, 2009. Pathologic stage was T1 C., N0, receptor positive, HER-2 negative. She has no known  recurrence to date.  She is on tamoxifen since she could not tolerate aromatase inhibitors. She sees Dr. Donnie Ashley every 6 months. She is also followed by Dr. Kelle Ashley.  Bilateral mammograms were performed on June 16, 2011. They are normal, category one. She has no specific complaint about her breast and is pleased with her surgical healing.   History reviewed. No pertinent past medical history. No medication comments found. Allergies  Allergen Reactions  . Chloraprep One Step Swelling and Rash  . Sulfur Swelling    Face and possibly entire body if allowed without intervention.  Marland Kitchen Dextroamphetamine Sulfate     Per patient she is unaware of reaction to this medication.  Marland Kitchen Penicillins Rash    Where injected    Review of Systems 12 system review of systems is performed and is negative except as described above.    Objective:   Physical Exam Constitutional: Patient alert. In No distress. Neck: No adenopathy, no mass, no jugular venous distention. Lungs: Clear to auscultation bilaterally. Heart: Regular rate and rhythm. No murmur. No ectopy. Breast:    both breast are examined. There is no palpable mass or axillary adenopathy on either side. Well-healed lumpectomy scar left breast, 9 oclock.. Well-healed axillary scar. Cosmesis very good.    Assessment:     Invasive ductal carcinoma left breast, stage TI C., N0, receptor positive, HER-2/neu negative. No evidence of recurrence 2 years following left partial mastectomy and sentinel node biopsy, adjuvant radiation therapy and adjuvant tamoxifen.    Plan:     Keep regular  followup appointment is with Dr. Donnie Ashley and with Dr. Tawanna Ashley. Continue tamoxifen.  Bilateral mammograms in one year and see me at that time.   Jessica Ashley. Jessica Ashley, M.D., [facility] General and Minimally invasive Surgery Breast and Colorectal Surgery Office:   [phone] Pager:   [phone]

## 2011-08-23 ENCOUNTER — Ambulatory Visit (INDEPENDENT_AMBULATORY_CARE_PROVIDER_SITE_OTHER): Payer: Medicare Other | Admitting: Family Medicine

## 2011-08-23 ENCOUNTER — Encounter: Payer: Self-pay | Admitting: Family Medicine

## 2011-08-23 VITALS — BP 110/70 | Temp 98.6°F | Ht 58.5 in | Wt 134.0 lb

## 2011-08-23 DIAGNOSIS — Z0000 Encounter for general adult medical examination without abnormal findings: Secondary | ICD-10-CM | POA: Diagnosis not present

## 2011-08-23 DIAGNOSIS — R5381 Other malaise: Secondary | ICD-10-CM

## 2011-08-23 DIAGNOSIS — R1030 Lower abdominal pain, unspecified: Secondary | ICD-10-CM

## 2011-08-23 DIAGNOSIS — R109 Unspecified abdominal pain: Secondary | ICD-10-CM

## 2011-08-23 DIAGNOSIS — R5383 Other fatigue: Secondary | ICD-10-CM | POA: Diagnosis not present

## 2011-08-23 DIAGNOSIS — C50919 Malignant neoplasm of unspecified site of unspecified female breast: Secondary | ICD-10-CM | POA: Diagnosis not present

## 2011-08-23 LAB — POCT URINALYSIS DIPSTICK
Bilirubin, UA: NEGATIVE
Glucose, UA: NEGATIVE
Ketones, UA: NEGATIVE
Nitrite, UA: NEGATIVE
Spec Grav, UA: 1.02

## 2011-08-23 NOTE — Patient Instructions (Signed)
Continue good health habits return in one year sooner if any problems  Consider the shingles vaccine

## 2011-08-23 NOTE — Progress Notes (Signed)
  Subjective:    Patient ID: Jessica Ashley, female    DOB: May 10, 1935, 77 y.o.   MRN: [id number]  HPI Jessica Ashley is a 77 year old married female nonsmoker who comes in today for evaluation of breast cancer  She's also here for a Medicare wellness exam  She had a left lumpectomy followed by postop radiation 2 years ago. She is currently followed in oncology and doing well. She's on tamoxifen  Is uses eyedrops for glaucoma and sees her doctor on a regular basis.  She gets routine eye care, dental care, BSE monthly, annual mammography, colonoscopy normal in GI, flu shot 2012, Pneumovax 2011, tetanus 2008. Information given on shingles  Cognitive function normal she walks on a regular basis home health safety reviewed no issues identified no guns in the house she does have a health care power of attorney and living will   Review of Systems  Constitutional: Negative.   HENT: Negative.   Eyes: Negative.   Respiratory: Negative.   Cardiovascular: Negative.   Gastrointestinal: Negative.   Genitourinary: Negative.   Musculoskeletal: Negative.   Neurological: Negative.   Hematological: Negative.   Psychiatric/Behavioral: Negative.        Objective:   Physical Exam  Constitutional: She appears well-developed and well-nourished.  HENT:  Head: Normocephalic and atraumatic.  Right Ear: External ear normal.  Left Ear: External ear normal.  Nose: Nose normal.  Mouth/Throat: Oropharynx is clear and moist.  Eyes: EOM are normal. Pupils are equal, round, and reactive to light.  Neck: Normal range of motion. Neck supple. No thyromegaly present.  Cardiovascular: Normal rate, regular rhythm, normal heart sounds and intact distal pulses.  Exam reveals no gallop and no friction rub.   No murmur heard. Pulmonary/Chest: Effort normal and breath sounds normal.  Abdominal: Soft. Bowel sounds are normal. She exhibits no distension and no mass. There is no tenderness. There is no rebound.    Genitourinary: Vagina normal and uterus normal. Guaiac negative stool. No vaginal discharge found.  Musculoskeletal: Normal range of motion.  Lymphadenopathy:    She has no cervical adenopathy.  Neurological: She is alert. She has normal reflexes. No cranial nerve deficit. She exhibits normal muscle tone. Coordination normal.  Skin: Skin is warm and dry.  Psychiatric: She has a normal mood and affect. Her behavior is normal. Judgment and thought content normal.   Pelvic and rectal examination not indicated       Assessment & Plan:  Healthy female  History of left-sided breast cancer 2 years ago currently asymptomatic continue followup in oncology  History of glaucoma continue eyedrops  Return in one year for Medicare wellness examination sooner if any problems

## 2011-11-21 ENCOUNTER — Other Ambulatory Visit (HOSPITAL_BASED_OUTPATIENT_CLINIC_OR_DEPARTMENT_OTHER): Payer: Medicare Other | Admitting: Lab

## 2011-11-21 DIAGNOSIS — C50919 Malignant neoplasm of unspecified site of unspecified female breast: Secondary | ICD-10-CM | POA: Diagnosis not present

## 2011-11-21 DIAGNOSIS — M810 Age-related osteoporosis without current pathological fracture: Secondary | ICD-10-CM | POA: Diagnosis not present

## 2011-11-21 DIAGNOSIS — E559 Vitamin D deficiency, unspecified: Secondary | ICD-10-CM

## 2011-11-21 LAB — CBC WITH DIFFERENTIAL/PLATELET
Basophils Absolute: 0 10*3/uL (ref 0.0–0.1)
Eosinophils Absolute: 0.2 10*3/uL (ref 0.0–0.5)
HGB: 14.1 g/dL (ref 11.6–15.9)
LYMPH%: 31.1 % (ref 14.0–49.7)
MONO#: 0.4 10*3/uL (ref 0.1–0.9)
NEUT#: 2.4 10*3/uL (ref 1.5–6.5)
Platelets: 133 10*3/uL — ABNORMAL LOW (ref 145–400)
RBC: 4.56 10*6/uL (ref 3.70–5.45)
RDW: 12.2 % (ref 11.2–14.5)
WBC: 4.3 10*3/uL (ref 3.9–10.3)

## 2011-11-22 DIAGNOSIS — H35379 Puckering of macula, unspecified eye: Secondary | ICD-10-CM | POA: Diagnosis not present

## 2011-11-22 DIAGNOSIS — H409 Unspecified glaucoma: Secondary | ICD-10-CM | POA: Diagnosis not present

## 2011-11-22 DIAGNOSIS — H4011X0 Primary open-angle glaucoma, stage unspecified: Secondary | ICD-10-CM | POA: Diagnosis not present

## 2011-11-22 DIAGNOSIS — H2510 Age-related nuclear cataract, unspecified eye: Secondary | ICD-10-CM | POA: Diagnosis not present

## 2011-11-22 LAB — COMPREHENSIVE METABOLIC PANEL
Albumin: 3.9 g/dL (ref 3.5–5.2)
CO2: 29 mEq/L (ref 19–32)
Glucose, Bld: 110 mg/dL — ABNORMAL HIGH (ref 70–99)
Potassium: 4.5 mEq/L (ref 3.5–5.3)
Sodium: 141 mEq/L (ref 135–145)
Total Protein: 6.4 g/dL (ref 6.0–8.3)

## 2011-11-22 LAB — VITAMIN D 25 HYDROXY (VIT D DEFICIENCY, FRACTURES): Vit D, 25-Hydroxy: 50 ng/mL (ref 30–89)

## 2011-11-23 DIAGNOSIS — H3581 Retinal edema: Secondary | ICD-10-CM | POA: Diagnosis not present

## 2011-11-23 DIAGNOSIS — H35379 Puckering of macula, unspecified eye: Secondary | ICD-10-CM | POA: Diagnosis not present

## 2011-11-28 ENCOUNTER — Other Ambulatory Visit: Payer: Medicare Other | Admitting: Lab

## 2011-11-28 ENCOUNTER — Telehealth: Payer: Self-pay | Admitting: *Deleted

## 2011-11-28 ENCOUNTER — Ambulatory Visit (HOSPITAL_BASED_OUTPATIENT_CLINIC_OR_DEPARTMENT_OTHER): Payer: Medicare Other | Admitting: Oncology

## 2011-11-28 VITALS — BP 114/71 | HR 78 | Temp 97.7°F | Ht 58.5 in | Wt 135.8 lb

## 2011-11-28 DIAGNOSIS — C50919 Malignant neoplasm of unspecified site of unspecified female breast: Secondary | ICD-10-CM

## 2011-11-28 DIAGNOSIS — C50119 Malignant neoplasm of central portion of unspecified female breast: Secondary | ICD-10-CM

## 2011-11-28 DIAGNOSIS — E559 Vitamin D deficiency, unspecified: Secondary | ICD-10-CM

## 2011-11-28 NOTE — Telephone Encounter (Signed)
Made patient appointment for mammogram on 06-17-2012 at the breast center at 11:00am gave patient appointment for 06-14-2012 lab only and 06-21-2012 md appointment printed out calendar and gave to the patient

## 2011-11-28 NOTE — Progress Notes (Signed)
Hematology and Oncology Follow Up Visit  Jessica Ashley [id number] [birth year] 77 y.o. 11/28/2011 10:12 AM PCP dr.jeffery todd  Principle Diagnosis: :  History of node negative breast cancer status post lumpectomy 07/16/2009, status post radiation therapy completed 10/11/2009 on adjuvant hormonal therapy, previously on arimidex, letrozole and most recently aromasin.  Interim History:  There have been no intercurrent illness, hospitalizations or medication changes. Stopped aromasin on 12/8, secondary to diarrhea which then resolved.Still having joint pains , minor hot flashes. She has been told she has a " hole in the retina", and needs cataract surgery.  Medications: I have reviewed the patient's current medications.  Allergies:  Allergies  Allergen Reactions  . Chlorhexidine Gluconate Swelling and Rash  . Sulfur Swelling    Face and possibly entire body if allowed without intervention.  Marland Kitchen Dextroamphetamine Sulfate     Per patient she is unaware of reaction to this medication.  Marland Kitchen Penicillins Rash    Where injected    Past Medical History, Surgical history, Social history, and Family History were reviewed and updated.  Review of Systems: Constitutional:  Negative for fever, chills, night sweats, anorexia, weight loss, pain. Cardiovascular: negative Respiratory: negative Neurological: negative Dermatological: negative ENT: negative Skin Gastrointestinal: negative Genito-Urinary: negative Hematological and Lymphatic: negative Breast: negative Musculoskeletal: negative Remaining ROS negative.  Physical Exam: Blood pressure 114/71, pulse 78, temperature 97.7 F (36.5 C), height 4' 10.5" (1.486 m), weight 135 lb 12.8 oz (61.598 kg). ECOG: 0 General appearance: alert, cooperative and appears stated age Head: Normocephalic, without obvious abnormality, atraumatic Neck: no adenopathy, no carotid bruit, no JVD, supple, symmetrical, trachea midline and thyroid not enlarged,  symmetric, no tenderness/mass/nodules Lymph nodes: Cervical, supraclavicular, and axillary nodes normal. Cardiac : regular rate and rhythm, no murmurs or gallops Pulmonary:clear to auscultation bilaterally and normal percussion bilaterally Breasts: inspection negative, no nipple discharge or bleeding, no masses or nodularity palpable Abdomen:soft, non-tender; bowel sounds normal; no masses,  no organomegaly Extremities negative Neuro: alert, oriented, normal speech, no focal findings or movement disorder noted  Lab Results: Lab Results  Component Value Date   WBC 4.3 11/21/2011   HGB 14.1 11/21/2011   HCT 41.9 11/21/2011   MCV 92.1 11/21/2011   PLT 133* 11/21/2011     Chemistry      Component Value Date/Time   NA 141 11/21/2011 1327   K 4.5 11/21/2011 1327   CL 107 11/21/2011 1327   CO2 29 11/21/2011 1327   BUN 14 11/21/2011 1327   CREATININE 0.79 11/21/2011 1327      Component Value Date/Time   CALCIUM 9.0 11/21/2011 1327   ALKPHOS 44 11/21/2011 1327   AST 19 11/21/2011 1327   ALT 11 11/21/2011 1327   BILITOT 0.3 11/21/2011 1327      .pathology. Radiological Studies: chest X-ray n/a Mammogram Due 1/13 Bone density n/a  Impression and Plan: Pt is doing well but doesn't tolerate AI. Will change to tamoxifen; discussed s/e.. F/u 6 mon   More than 50% of the visit was spent in patient-related counselling   Pierce Crane, MD 7/2/201310:12 AM

## 2011-11-28 NOTE — Progress Notes (Signed)
Hematology and Oncology Follow Up Visit  Jessica Ashley [id number] 1934-09-10 77 y.o. 11/28/2011 10:59 AM PCP dr.jeffery todd  Principle Diagnosis: :  History of node negative breast cancer status post lumpectomy 07/16/2009, status post radiation therapy completed 10/11/2009 on adjuvant hormonal therapy, previously on arimidex, letrozole , aromasin and tamoxifen.  Interim History:  There have been no intercurrent illness, hospitalizations or medication changes. Stopped aromasin on 12/8, secondary to diarrhea which then resolved. She is tolerating tamoxifen fairly well though again having occasional joint pains. She has been seen by her ophthalmologist who has noted that she needs cataract surgery as well as a cornea repair. In addition appears to be a problem with her retina and possibly a "hole" there.  Medications: I have reviewed the patient's current medications.  Allergies:  Allergies  Allergen Reactions  . Chlorhexidine Gluconate Swelling and Rash  . Sulfur Swelling    Face and possibly entire body if allowed without intervention.  Marland Kitchen Dextroamphetamine Sulfate     Per patient she is unaware of reaction to this medication.  Marland Kitchen Penicillins Rash    Where injected    Past Medical History, Surgical history, Social history, and Family History were reviewed and updated.  Review of Systems: Constitutional:  Negative for fever, chills, night sweats, anorexia, weight loss, pain. Cardiovascular: negative Respiratory: negative Neurological: negative Dermatological: negative ENT: negative Skin Gastrointestinal: negative Genito-Urinary: negative Hematological and Lymphatic: negative Breast: negative Musculoskeletal: negative Remaining ROS negative.  Physical Exam: Blood pressure 114/71, pulse 78, temperature 97.7 F (36.5 C), height 4' 10.5" (1.486 m), weight 135 lb 12.8 oz (61.598 kg). ECOG: 0 General appearance: alert, cooperative and appears stated age Head: Normocephalic,  without obvious abnormality, atraumatic Neck: no adenopathy, no carotid bruit, no JVD, supple, symmetrical, trachea midline and thyroid not enlarged, symmetric, no tenderness/mass/nodules Lymph nodes: Cervical, supraclavicular, and axillary nodes normal. Cardiac : regular rate and rhythm, no murmurs or gallops Pulmonary:clear to auscultation bilaterally and normal percussion bilaterally Breasts: inspection negative, no nipple discharge or bleeding, no masses or nodularity palpable Abdomen:soft, non-tender; bowel sounds normal; no masses,  no organomegaly Extremities negative Neuro: alert, oriented, normal speech, no focal findings or movement disorder noted  Lab Results: Lab Results  Component Value Date   WBC 4.3 11/21/2011   HGB 14.1 11/21/2011   HCT 41.9 11/21/2011   MCV 92.1 11/21/2011   PLT 133* 11/21/2011     Chemistry      Component Value Date/Time   NA 141 11/21/2011 1327   K 4.5 11/21/2011 1327   CL 107 11/21/2011 1327   CO2 29 11/21/2011 1327   BUN 14 11/21/2011 1327   CREATININE 0.79 11/21/2011 1327      Component Value Date/Time   CALCIUM 9.0 11/21/2011 1327   ALKPHOS 44 11/21/2011 1327   AST 19 11/21/2011 1327   ALT 11 11/21/2011 1327   BILITOT 0.3 11/21/2011 1327      .pathology. Radiological Studies: chest X-ray n/a Mammogram Due 1/13- within normal limits Bone density June 2014.  Impression and Plan: Patient is doing fairly well. She has recently changed ophthalmologists. She has been on tamoxifen for 6 months and so is unclear as to whether the problem in her retina/macula his root is new or old. I plan a followup visit with her ophthalmologist. She'll return in 6 months and we will reschedule her a mammogram. She'll also need a bone density test done next year. Her bone density test last checked was borderline for osteoporosis and. She does not  tolerate Fosamax we need to relook at this as to see whether she should receive prolia. I reviewed her lab is with her  today. Her vitamin D level is quite good. She is taking vitamin D. I also reviewed her  body mass index with her as well and discussed implications of that.     More than 50% of the visit was spent in patient-related counselling   Pierce Crane, MD 7/2/201310:59 AM

## 2012-02-12 DIAGNOSIS — H6120 Impacted cerumen, unspecified ear: Secondary | ICD-10-CM | POA: Diagnosis not present

## 2012-02-16 ENCOUNTER — Ambulatory Visit (INDEPENDENT_AMBULATORY_CARE_PROVIDER_SITE_OTHER): Payer: Medicare Other

## 2012-02-16 DIAGNOSIS — Z23 Encounter for immunization: Secondary | ICD-10-CM

## 2012-03-21 DIAGNOSIS — H35379 Puckering of macula, unspecified eye: Secondary | ICD-10-CM | POA: Diagnosis not present

## 2012-04-15 ENCOUNTER — Ambulatory Visit (INDEPENDENT_AMBULATORY_CARE_PROVIDER_SITE_OTHER): Payer: Medicare Other | Admitting: Family Medicine

## 2012-04-15 ENCOUNTER — Telehealth: Payer: Self-pay | Admitting: Family Medicine

## 2012-04-15 VITALS — BP 116/60 | HR 112 | Temp 99.6°F | Resp 18 | Ht <= 58 in | Wt 132.0 lb

## 2012-04-15 DIAGNOSIS — H409 Unspecified glaucoma: Secondary | ICD-10-CM | POA: Insufficient documentation

## 2012-04-15 DIAGNOSIS — J40 Bronchitis, not specified as acute or chronic: Secondary | ICD-10-CM

## 2012-04-15 DIAGNOSIS — M899 Disorder of bone, unspecified: Secondary | ICD-10-CM | POA: Diagnosis not present

## 2012-04-15 DIAGNOSIS — H7490 Unspecified disorder of middle ear and mastoid, unspecified ear: Secondary | ICD-10-CM | POA: Diagnosis not present

## 2012-04-15 DIAGNOSIS — M8580 Other specified disorders of bone density and structure, unspecified site: Secondary | ICD-10-CM

## 2012-04-15 DIAGNOSIS — M949 Disorder of cartilage, unspecified: Secondary | ICD-10-CM | POA: Diagnosis not present

## 2012-04-15 MED ORDER — LEVOFLOXACIN 500 MG PO TABS: 500.0000 mg | ORAL_TABLET | Freq: Every day | ORAL | Status: DC

## 2012-04-15 MED ORDER — HYDROCODONE-HOMATROPINE 5-1.5 MG/5ML PO SYRP: 5.0000 mL | ORAL_SOLUTION | Freq: Four times a day (QID) | ORAL | Status: DC | PRN

## 2012-04-15 NOTE — Progress Notes (Signed)
@[email or web address] @   Patient ID: Jessica Ashley MRN: [id number], DOB: 1935-02-17, 77 y.o. Date of Encounter: 04/15/2012, 5:52 PM  Primary Physician: Evette Georges, MD  Chief Complaint:  Chief Complaint  Patient presents with  . URI  . Fever    comes and goes since last wednesday  . Generalized Body Aches    HPI: 77 y.o. year old female presentsmale presents with a 7 day history of nasal congestion, post nasal drip, sore throat, and cough. Mild sinus pressure. Afebrile. No chills. Nasal congestion thick and green/yellow. Cough is productive of green/yellow sputum and not associated with time of day. Ears feel full, leading to sensation of muffled hearing. Has tried OTC cold preps without success. No GI complaints. Appetite poor  No sick contacts, recent antibiotics, or recent travels.   No leg trauma, sedentary periods, h/o cancer, or tobacco use.  Past Medical History  Diagnosis Date  . Cancer   . Cataract   . Glaucoma   . Thyroid disease     thyroid surgery     Home Meds: Prior to Admission medications   Medication Sig Start Date End Date Taking? Authorizing Provider  aspirin 81 MG tablet Take 81 mg by mouth daily.     Yes Historical Provider, MD  Calcium Carbonate-Vitamin D (CALCIUM-VITAMIN D3 PO) Take 1 tablet by mouth daily.    Yes Historical Provider, MD  Cholecalciferol (VITAMIN D) 2000 UNITS CAPS Take by mouth.     Yes Historical Provider, MD  docusate sodium (COLACE) 50 MG capsule Take by mouth daily.     Yes Historical Provider, MD  OVER THE COUNTER MEDICATION Take 1 capsule by mouth daily. "fiber pill"    Yes Historical Provider, MD  tamoxifen (NOLVADEX) 20 MG tablet Daily. 05/31/11  Yes Historical Provider, MD  TRAVATAN Z 0.004 % SOLN ophthalmic solution  07/27/11  Yes Historical Provider, MD    Allergies:  Allergies  Allergen Reactions  . Chlorhexidine Gluconate Swelling and Rash  . Sulfur Swelling    Face and possibly entire body if allowed without intervention.  Marland Kitchen  Dextroamphetamine Sulfate     Per patient she is unaware of reaction to this medication.  Marland Kitchen Penicillins Rash    Where injected    History   Social History  . Marital Status: Married    Spouse Name: N/A    Number of Children: N/A  . Years of Education: N/A   Occupational History  . Not on file.   Social History Main Topics  . Smoking status: Never Smoker   . Smokeless tobacco: Never Used  . Alcohol Use: No  . Drug Use: Not on file  . Sexually Active: Yes -- Female partner(s)     Comment: married   Other Topics Concern  . Not on file   Social History Narrative  . No narrative on file     Review of Systems:  Constitutional: positive for chills, fever, night sweats; negative or weight changes Cardiovascular: negative for chest pain or palpitations Respiratory: negative for hemoptysis, wheezing, or shortness of breath Abdominal: negative for abdominal pain, nausea, vomiting or diarrhea Dermatological: negative for rash Neurologic: negative for headache   Physical Exam: Blood pressure 116/60, pulse 112, temperature 99.6 F (37.6 C), temperature source Oral, resp. rate 18, height 4\' 10"  (1.473 m), weight 132 lb (59.875 kg), SpO2 94.00%., Body mass index is 27.59 kg/(m^2). General: Well developed, well nourished, in no acute distress. Head: Normocephalic, atraumatic, eyes without discharge, sclera non-icteric, nares are congested. Bilateral auditory  canals clear, TM's are without perforation, pearly grey with reflective cone of light bilaterally. No sinus TTP. Oral cavity moist, dentition normal. Posterior pharynx with post nasal drip and mild erythema. No peritonsillar abscess or tonsillar exudate. Neck: Supple. No thyromegaly. Full ROM. No lymphadenopathy. Lungs: Coarse breath sounds bilaterally without wheezes, rales, or rhonchi. Breathing is unlabored.  Heart: RRR with S1 S2. No murmurs, rubs, or gallops appreciated. Msk:  Strength and tone normal for age. Extremities: No  clubbing or cyanosis. No edema. Neuro: Alert and oriented X 3. Moves all extremities spontaneously. CNII-XII grossly in tact. Psych:  Responds to questions appropriately with a normal affect.     ASSESSMENT AND PLAN:  77 y.o. year old female with bronchitis. - -Mucinex -Tylenol/Motrin prn -Rest/fluids -RTC precautions -RTC 3-5 days if no improvement  Signed, Elvina Sidle, MD 04/15/2012 5:52 PM  Also discussed this visit:  Osteopenia and glaucoma (vs Foke's dystrophy)

## 2012-04-15 NOTE — Patient Instructions (Signed)

## 2012-04-15 NOTE — Telephone Encounter (Signed)
Patient Information:  Caller Name: Linette  Phone: [phone]  Patient: Jessica Ashley, Jessica Ashley  Gender: Female  DOB: 11-16-1934  Age: 77 Years  PCP: Kelle Darting [facility])   Symptoms  Reason For Call & Symptoms: Cough and fever since 11-13.  Reviewed Health History In EMR: Yes  Reviewed Medications In EMR: Yes  Reviewed Allergies In EMR: Yes  Date of Onset of Symptoms: 04/10/2012  Treatments Tried: Mucinex and Motrin  Treatments Tried Worked: No  Any Fever: Yes  Fever Taken: Oral  Fever Time Of Reading: 15:00:00  Fever Last Reading: 101  Guideline(s) Used:  Cough  Disposition Per Guideline:   See Today in Office  Reason For Disposition Reached:   Fever present > 3 days (72 hours)  Advice Given:  N/A  Office Follow Up:  Does the office need to follow up with this patient?: No  Instructions For The Office: N/A  Patient Refused Recommendation:  Patient Will Make Own Appointment  Advised Urgent Care but refuses and will call office 11-19 for appointment

## 2012-05-17 DIAGNOSIS — Z853 Personal history of malignant neoplasm of breast: Secondary | ICD-10-CM | POA: Diagnosis not present

## 2012-05-17 DIAGNOSIS — Z01419 Encounter for gynecological examination (general) (routine) without abnormal findings: Secondary | ICD-10-CM | POA: Diagnosis not present

## 2012-05-17 DIAGNOSIS — N951 Menopausal and female climacteric states: Secondary | ICD-10-CM | POA: Diagnosis not present

## 2012-05-17 DIAGNOSIS — Z124 Encounter for screening for malignant neoplasm of cervix: Secondary | ICD-10-CM | POA: Diagnosis not present

## 2012-05-27 ENCOUNTER — Other Ambulatory Visit: Payer: Self-pay | Admitting: Oncology

## 2012-05-27 DIAGNOSIS — C50919 Malignant neoplasm of unspecified site of unspecified female breast: Secondary | ICD-10-CM

## 2012-05-30 ENCOUNTER — Other Ambulatory Visit: Payer: Medicare Other | Admitting: Lab

## 2012-06-04 ENCOUNTER — Ambulatory Visit: Payer: Medicare Other | Admitting: Oncology

## 2012-06-14 ENCOUNTER — Other Ambulatory Visit: Payer: Medicare Other | Admitting: Lab

## 2012-06-17 ENCOUNTER — Ambulatory Visit
Admission: RE | Admit: 2012-06-17 | Discharge: 2012-06-17 | Disposition: A | Discharge status: Home / Self Care | Payer: Medicare Other | Source: Ambulatory Visit | Attending: Oncology | Admitting: Oncology

## 2012-06-17 DIAGNOSIS — Z853 Personal history of malignant neoplasm of breast: Secondary | ICD-10-CM | POA: Diagnosis not present

## 2012-06-17 DIAGNOSIS — C50919 Malignant neoplasm of unspecified site of unspecified female breast: Secondary | ICD-10-CM

## 2012-06-17 DIAGNOSIS — E559 Vitamin D deficiency, unspecified: Secondary | ICD-10-CM

## 2012-06-21 ENCOUNTER — Ambulatory Visit: Payer: Medicare Other | Admitting: Oncology

## 2012-07-03 ENCOUNTER — Telehealth: Payer: Self-pay | Admitting: *Deleted

## 2012-07-03 NOTE — Telephone Encounter (Signed)
Called and spoke with patient to reschedule her appt. Confirmed appt for 07/11/12 at 1100am with Jacquie Hunter,NP.  Patient requests Dr.Khan.

## 2012-07-04 ENCOUNTER — Encounter: Payer: Self-pay | Admitting: Oncology

## 2012-07-10 ENCOUNTER — Telehealth: Payer: Self-pay | Admitting: Oncology

## 2012-07-10 NOTE — Telephone Encounter (Signed)
due to weather pt called to r/s tomorrows appt

## 2012-07-11 ENCOUNTER — Ambulatory Visit: Payer: Medicare Other | Admitting: Family

## 2012-07-15 ENCOUNTER — Encounter (INDEPENDENT_AMBULATORY_CARE_PROVIDER_SITE_OTHER): Payer: Self-pay | Admitting: General Surgery

## 2012-07-15 ENCOUNTER — Ambulatory Visit (INDEPENDENT_AMBULATORY_CARE_PROVIDER_SITE_OTHER): Payer: Medicare Other | Admitting: General Surgery

## 2012-07-15 VITALS — BP 118/60 | HR 72 | Temp 97.4°F | Resp 72 | Ht 59.0 in | Wt 135.2 lb

## 2012-07-15 DIAGNOSIS — C50919 Malignant neoplasm of unspecified site of unspecified female breast: Secondary | ICD-10-CM

## 2012-07-15 NOTE — Patient Instructions (Signed)
Your breast and lymph node exam today is normal. Your recent mammograms also normal. You have no evidence of cancer.  Keep your regular appointments with Dr. Drue Second.  Continue taking the tamoxifen  Return to see Dr. Derrell Lolling in one year after you get your annual mammograms

## 2012-07-15 NOTE — Progress Notes (Signed)
Patient ID: Jessica Ashley, female   DOB: Jun 14, 1934, 78 y.o.   MRN: [id number] History: This patient underwent left partial mastectomy and sentinel node biopsy on 07/16/2009, postop adjuvant radiation therapy and she is on adjuvant tamoxifen. Final pathologic stage T1c,  N0, receptor positive, HER-2 negative. She has no new complaints about her breasts and has had no new health problems. She is followed by Dr. Kelle Darting for primary care. Bilateral mammograms performed on 06/17/2012 look good. No focal abnormality. Category 2.  ROS: 10 system review of systems is negative except as described above.  Exam: Patient looks well. No distress. Good spirits. Neck no adenopathy mass or jugular venous distention Lungs: Clear to auscultation bilaterally Heart: Regular rate and rhythm. No murmur. No ectopy. Breasts: Both breasts medium size. Well-healed transverse scar left breast at 9:00. Well-healed left axillary scar. No palpable mass. No other skin changes. Contour, projection and cosmesis very good.  Assessment: Invasive carcinoma left breast, medial quadrant, pathologic stage T1c., N0, receptor positive, HER-2 negative. No evidence of recurrence 3 years following left partial mastectomy sentinel node biopsy, adjuvant radiation therapy and ongoing adjuvant tamoxifen therapy  Plan: Continue regular follow up with Dr. Drue Second, who is now her medical oncologist Mammograms in 1 year Return to see Dr. Derrell Lolling in one year.   [name]. Derrell Lolling, M.D., [facility] General and Minimally invasive Surgery Breast and Colorectal Surgery Office:   [phone] Pager:   [phone]

## 2012-07-18 ENCOUNTER — Telehealth: Payer: Self-pay | Admitting: Oncology

## 2012-07-18 ENCOUNTER — Encounter: Payer: Self-pay | Admitting: Family

## 2012-07-18 ENCOUNTER — Ambulatory Visit: Payer: Medicare Other | Admitting: Lab

## 2012-07-18 ENCOUNTER — Ambulatory Visit (HOSPITAL_BASED_OUTPATIENT_CLINIC_OR_DEPARTMENT_OTHER): Payer: Medicare Other | Admitting: Family

## 2012-07-18 VITALS — BP 121/75 | HR 76 | Temp 98.5°F | Resp 18 | Ht 59.0 in | Wt 138.9 lb

## 2012-07-18 DIAGNOSIS — Z170 Estrogen receptor positive status [ER+]: Secondary | ICD-10-CM | POA: Diagnosis not present

## 2012-07-18 DIAGNOSIS — C50419 Malignant neoplasm of upper-outer quadrant of unspecified female breast: Secondary | ICD-10-CM | POA: Diagnosis not present

## 2012-07-18 DIAGNOSIS — R609 Edema, unspecified: Secondary | ICD-10-CM

## 2012-07-18 DIAGNOSIS — C50919 Malignant neoplasm of unspecified site of unspecified female breast: Secondary | ICD-10-CM

## 2012-07-18 LAB — COMPREHENSIVE METABOLIC PANEL (CC13)
Alkaline Phosphatase: 49 U/L (ref 40–150)
BUN: 14.3 mg/dL (ref 7.0–26.0)
CO2: 28 mEq/L (ref 22–29)
Glucose: 95 mg/dl (ref 70–99)
Sodium: 144 mEq/L (ref 136–145)
Total Bilirubin: 0.32 mg/dL (ref 0.20–1.20)
Total Protein: 7.3 g/dL (ref 6.4–8.3)

## 2012-07-18 LAB — CBC WITH DIFFERENTIAL/PLATELET
BASO%: 0.5 % (ref 0.0–2.0)
Basophils Absolute: 0 10*3/uL (ref 0.0–0.1)
EOS%: 5.2 % (ref 0.0–7.0)
Eosinophils Absolute: 0.2 10*3/uL (ref 0.0–0.5)
HCT: 42 % (ref 34.8–46.6)
HGB: 14 g/dL (ref 11.6–15.9)
LYMPH%: 27 % (ref 14.0–49.7)
MCH: 30.2 pg (ref 25.1–34.0)
MCHC: 33.2 g/dL (ref 31.5–36.0)
MCV: 90.8 fL (ref 79.5–101.0)
MONO#: 0.4 10*3/uL (ref 0.1–0.9)
MONO%: 9.3 % (ref 0.0–14.0)
NEUT#: 2.8 10*3/uL (ref 1.5–6.5)
NEUT%: 58 % (ref 38.4–76.8)
Platelets: 128 10*3/uL — ABNORMAL LOW (ref 145–400)
RBC: 4.63 10*6/uL (ref 3.70–5.45)
RDW: 12.5 % (ref 11.2–14.5)
WBC: 4.8 10*3/uL (ref 3.9–10.3)
lymph#: 1.3 10*3/uL (ref 0.9–3.3)

## 2012-07-18 MED ORDER — TAMOXIFEN CITRATE 20 MG PO TABS: 20.0000 mg | ORAL_TABLET | Freq: Every day | ORAL | Status: DC

## 2012-07-18 NOTE — Progress Notes (Signed)
[facility]  Telephone:([phone] [phone]  OFFICE PROGRESS NOTE  PATIENT: Jessica Ashley   DOB: [birth year]  MR#: [id number]  JYN#:[id number]  QM:VHQI,ONGEXBM ALLEN, MD [name]. Derrell Lolling, MD  DIAGNOSIS:  78 year old [place] female with invasive ductal carcinoma of the left breast that was diagnosed in 2011.  PRIOR THERAPY: 1. Status post left breast lumpectomy with sentinel biopsy on 07/16/2009. ER 98%, PR 100%, Ki-67 8%, HER-2/neu by C[facility] no amplification with a ratio of 1.40.  2. Status post radiation therapy from 08/26/2009 through 10/06/2009.  3. Adjuvant antiestrogen therapy with Arimidex/Letrozole/Aromasin were all attempted, but the patient did not tolerate any of them well.  CURRENT THERAPY:  Tamoxifen 20 mg by mouth daily since 09/2009.     INTERVAL HISTORY: Dr.Khan and I saw Jessica Ashley today for follow up of invasive ductal carcinoma.  She was last seen by Dr. Donnie Coffin on 11/28/2011.  Since her last office visit, the patient has complaints of right foot swelling that has persisted for the past 2 years, in addition to night sweats and hot flashes. The patient denies any other symptomatology   PAST MEDICAL HISTORY: Past Medical History  Diagnosis Date  . Cancer   . Cataract   . Glaucoma   . Thyroid disease     thyroid surgery    PAST SURGICAL HISTORY: Past Surgical History  Procedure Laterality Date  . Cholecystectomy  2011  . Breast lumpectomy  2011  . Tonsillectomy  1953  . Dilation and curettage, diagnostic / therapeutic  1993    FAMILY HISTORY: Family History  Problem Relation Age of Onset  . Cancer Sister 23    bladder  . Pneumonia Mother   . Heart disease Mother     SOCIAL HISTORY: History  Substance Use Topics  . Smoking status: Never Smoker   . Smokeless tobacco: Never Used  . Alcohol Use: No    ALLERGIES: Allergies  Allergen Reactions  . Chlorhexidine Gluconate Swelling and Rash  . Sulfur Swelling   Face and possibly entire body if allowed without intervention.  Marland Kitchen Dextroamphetamine Sulfate     Per patient she is unaware of reaction to this medication.  . Sulfa Antibiotics Swelling  . Penicillins Rash    Where injected     MEDICATIONS:  Current Outpatient Prescriptions  Medication Sig Dispense Refill  . aspirin 81 MG tablet Take 81 mg by mouth daily.        . Calcium Carbonate-Vitamin D (CALCIUM-VITAMIN D3 PO) Take 1 tablet by mouth daily.       . Cholecalciferol (VITAMIN D) 2000 UNITS CAPS Take by mouth.        . docusate sodium (COLACE) 50 MG capsule Take by mouth daily.        Marland Kitchen OVER THE COUNTER MEDICATION Take 1 capsule by mouth every other day. "fiber pill"      . sodium chloride (MURO 128) 5 % ophthalmic solution 1 drop daily.      . tamoxifen (NOLVADEX) 20 MG tablet Take 1 tablet (20 mg total) by mouth daily.  30 tablet  24  . TRAVATAN Z 0.004 % SOLN ophthalmic solution        No current facility-administered medications for this visit.      REVIEW OF SYSTEMS: A 10 point review of systems was completed and is negative except as noted above.    PHYSICAL EXAMINATION: BP 121/75  Pulse 76  Temp(Src) 98.5 F (36.9 C) (Oral)  Resp 18  Ht  4\' 11"  (1.499 m)  Wt 138 lb 14.4 oz (63.005 kg)  BMI 28.04 kg/m2   General appearance: Alert, cooperative, well nourished, no apparent distress Head: Normocephalic, without obvious abnormality, atraumatic Eyes: Arcus senilis, PERRLA, EOMI Nose: Nares, septum and mucosa are normal, no drainage or sinus tenderness. Neck: No adenopathy, supple, symmetrical, trachea midline, thyroid not enlarged, no tenderness Resp: Clear to auscultation bilaterally Cardio: Regular rate and rhythm, S1, S2 normal, no murmur, click, rub or gallop Breasts: Soft bilaterally, pendulous, left breast well-healed surgical scar, visible radiation changes, no lymphadenopathy, no nipple inversion, no axilla fullness, benign breast exam GI: Soft, distended,  non-tender, hypoactive bowel sounds, no organomegaly Skin:  Scattered nevi on trunk area Extremities: Extremities normal, atraumatic, no cyanosis or edema Lymph nodes: Cervical, supraclavicular, and axillary nodes normal Neurologic: Grossly normal    ECOG FS:  Grade 1 - Symptomatic but completely ambulatory   LAB RESULTS: Lab Results  Component Value Date   WBC 4.8 07/18/2012   NEUTROABS 2.8 07/18/2012   HGB 14.0 07/18/2012   HCT 42.0 07/18/2012   MCV 90.8 07/18/2012   PLT 128* 07/18/2012      Chemistry      Component Value Date/Time   NA 144 07/18/2012 1224   NA 141 11/21/2011 1327   K 4.1 07/18/2012 1224   K 4.5 11/21/2011 1327   CL 105 07/18/2012 1224   CL 107 11/21/2011 1327   CO2 28 07/18/2012 1224   CO2 29 11/21/2011 1327   BUN 14.3 07/18/2012 1224   BUN 14 11/21/2011 1327   CREATININE 0.7 07/18/2012 1224   CREATININE 0.79 11/21/2011 1327      Component Value Date/Time   CALCIUM 9.5 07/18/2012 1224   CALCIUM 9.0 11/21/2011 1327   ALKPHOS 49 07/18/2012 1224   ALKPHOS 44 11/21/2011 1327   AST 19 07/18/2012 1224   AST 19 11/21/2011 1327   ALT 15 07/18/2012 1224   ALT 11 11/21/2011 1327   BILITOT 0.32 07/18/2012 1224   BILITOT 0.3 11/21/2011 1327      Lab Results  Component Value Date   LABCA2 15 05/19/2011     RADIOGRAPHIC STUDIES: No results found.   ASSESSMENT: 78 y.o. woman with: 1. T1c N0 left breast invasive ductal carcinoma diagnosed in 2011, status post lumpectomy with sentinel biopsy on 07/16/2009, status post radiation therapy completed on 10/06/2009 adjuvant antiestrogen therapy since 09/2009.  2. Right lower extremity edema.  3. Night sweats and hot flashes.   PLAN: 1. The patient will continue with Tamoxifen 20 mg by mouth daily until 09/2014. An electronic prescription for this medication #30 with 24 refills was sent to her pharmacy today.  2. The patient did not have edema today, but was asked to elevate her right lower extremity, avoid sodium, and wear  compression stockings when she experiences edema in her right lower extremity again.  3. The patient was asked to consider trying OTC Peridin-C for relief of hot flashes/night sweats.  4.  The patient is over due for a repeat colonoscopy and will need another bone density scan around 03/2013.  5. We plan to see the patient again in 6 months time (01/15/2013), and will alternate with her surgeon, Dr. Derrell Lolling for her office visits.  Laboratories of CMP and CBC will be obtained at her next office visit with [place].   All questions were answered.  The patient was encouraged to contact us in the interim with any problems, questions or concerns.    Larina Bras, NP-C  07/18/2012, 7:28 PM

## 2012-07-18 NOTE — Patient Instructions (Addendum)
Please contact us at [phone] if you have any questions or concerns.  Consider Peridin-C for hot flashes/night sweats

## 2012-09-03 ENCOUNTER — Ambulatory Visit (INDEPENDENT_AMBULATORY_CARE_PROVIDER_SITE_OTHER): Payer: Medicare Other | Admitting: Family Medicine

## 2012-09-03 ENCOUNTER — Encounter: Payer: Self-pay | Admitting: Family Medicine

## 2012-09-03 VITALS — BP 110/80 | Temp 98.4°F | Ht 59.0 in | Wt 138.0 lb

## 2012-09-03 DIAGNOSIS — C50912 Malignant neoplasm of unspecified site of left female breast: Secondary | ICD-10-CM

## 2012-09-03 DIAGNOSIS — M899 Disorder of bone, unspecified: Secondary | ICD-10-CM

## 2012-09-03 DIAGNOSIS — C50919 Malignant neoplasm of unspecified site of unspecified female breast: Secondary | ICD-10-CM

## 2012-09-03 DIAGNOSIS — M8580 Other specified disorders of bone density and structure, unspecified site: Secondary | ICD-10-CM

## 2012-09-03 NOTE — Progress Notes (Signed)
  Subjective:    Patient ID: Jessica Ashley, female    DOB: 1935-04-17, 78 y.o.   MRN: [id number]  HPI Aqua is a delightful 78 year old married female nonsmoker who comes in today for a Medicare wellness exam because of a history of breast cancer  4 years ago she was diagnosed to have a cancer left breast at the 9:00 position. She underwent lumpectomy and postop radiation and has done well. She's currently followed in oncology by Dr. Park Breed..  She also has a pelvic exam by a nurse once a year and a Pap smear.  She gets routine eye care, dental care, BSE monthly, and you mammography, vaccinations up-to-date, no history of previous colon problems therefore she states that of colonoscopy  Vaccinations up-to-date except recommended shingles vaccine,,,,,,,,, she declines Cognitive function normal she walks on a regular basis home health safety reviewed no issues identified, no guns in the house, she does have a health care power of attorney and living well  Review of Systems  Constitutional: Negative.        Abnormal lesion right lower extremity  HENT: Negative.   Eyes: Negative.   Respiratory: Negative.   Cardiovascular: Negative.   Gastrointestinal: Negative.   Genitourinary: Negative.   Musculoskeletal: Negative.   Neurological: Negative.   Psychiatric/Behavioral: Negative.        Objective:   Physical Exam  Constitutional: She appears well-developed and well-nourished.  HENT:  Head: Normocephalic and atraumatic.  Right Ear: External ear normal.  Left Ear: External ear normal.  Nose: Nose normal.  Mouth/Throat: Oropharynx is clear and moist.  Eyes: EOM are normal. Pupils are equal, round, and reactive to light.  Neck: Normal range of motion. Neck supple. No thyromegaly present.  Cardiovascular: Normal rate, regular rhythm, normal heart sounds and intact distal pulses.  Exam reveals no gallop and no friction rub.   No murmur heard. No carotid bruits aorta normal no bruits  peripheral pulses normal  Pulmonary/Chest: Effort normal and breath sounds normal.  Abdominal: Soft. Bowel sounds are normal. She exhibits no distension and no mass. There is no tenderness. There is no rebound.  Genitourinary:  Pelvic examination done by nurse at GYN office  Right breast normal left breast scar at 9:00 from previous excisional lumpectomy and tattoos from radiation. No palpable lesions axilla normal  Musculoskeletal: Normal range of motion.  Lymphadenopathy:    She has no cervical adenopathy.  Neurological: She is alert. She has normal reflexes. No cranial nerve deficit. She exhibits normal muscle tone. Coordination normal.  Skin: Skin is warm and dry.  10 mm x10 mm black lesion right lower extremity advised to return for removal ASAP  Psychiatric: She has a normal mood and affect. Her behavior is normal. Judgment and thought content normal.          Assessment & Plan:  Healthy female  4 years status post left breast cancer continue monthly BSE at home, and you mammography, oncology followup  Abnormal lesion right lower extremity return for removal ASAP

## 2012-09-03 NOTE — Patient Instructions (Signed)
Continue your current medication and good health habits  Return in one year for general Medicare wellness examination sooner if any problems  Return sometime in the next couple weeks for removal of the lesion on your right lower leg

## 2012-09-25 ENCOUNTER — Ambulatory Visit (INDEPENDENT_AMBULATORY_CARE_PROVIDER_SITE_OTHER): Payer: BLUE CROSS/BLUE SHIELD | Admitting: Family Medicine

## 2012-09-25 ENCOUNTER — Encounter: Payer: Self-pay | Admitting: Family Medicine

## 2012-09-25 DIAGNOSIS — D235 Other benign neoplasm of skin of trunk: Secondary | ICD-10-CM | POA: Diagnosis not present

## 2012-09-25 DIAGNOSIS — L851 Acquired keratosis [keratoderma] palmaris et plantaris: Secondary | ICD-10-CM | POA: Diagnosis not present

## 2012-09-25 NOTE — Progress Notes (Signed)
  Subjective:    Patient ID: Renato Gails, female    DOB: [birth year], 78 y.o.   MRN: [id number]  HPI  Cheris is a 78 year old married female nonsmoker who comes in today for removal of a lesion on her right lower examination  She states she's had a lesion there for many years most recently is crowding got inflamed and irritated and painful. No evidence of infection.  It measures 10 MM x  10 MM sessile papule with dark pigmentation  After informed consent the lesion was cleaned with alcohol and anesthetized with 1% Xylocaine with epinephrine. It was excised with 3 mm margins and sent for pathologic analysis. The base was cauterized Band-Aids applied she tolerated the procedure no complications.  Review of Systems Review of systems negative    Objective:   Physical Exam  Procedure see above      Assessment & Plan:  Clinically this appears to be a dysplastic nevus Path pending

## 2012-09-25 NOTE — Patient Instructions (Signed)
Remove the Band-Aid in the morning  Clean the area with peroxide,,,,, apply antibiotic ointment,,,,,, cover with a Band-Aid  Do this for 3 days then leave it open to the air

## 2012-11-20 DIAGNOSIS — H3581 Retinal edema: Secondary | ICD-10-CM | POA: Diagnosis not present

## 2012-11-20 DIAGNOSIS — H409 Unspecified glaucoma: Secondary | ICD-10-CM | POA: Diagnosis not present

## 2012-11-20 DIAGNOSIS — H4011X0 Primary open-angle glaucoma, stage unspecified: Secondary | ICD-10-CM | POA: Diagnosis not present

## 2012-12-23 DIAGNOSIS — H4011X0 Primary open-angle glaucoma, stage unspecified: Secondary | ICD-10-CM | POA: Diagnosis not present

## 2012-12-23 DIAGNOSIS — H409 Unspecified glaucoma: Secondary | ICD-10-CM | POA: Diagnosis not present

## 2013-01-09 ENCOUNTER — Telehealth: Payer: Self-pay | Admitting: Oncology

## 2013-01-15 ENCOUNTER — Other Ambulatory Visit: Payer: Medicare Other | Admitting: Lab

## 2013-01-15 ENCOUNTER — Ambulatory Visit: Payer: Medicare Other | Admitting: Oncology

## 2013-01-24 DIAGNOSIS — H4011X0 Primary open-angle glaucoma, stage unspecified: Secondary | ICD-10-CM | POA: Diagnosis not present

## 2013-01-24 DIAGNOSIS — H409 Unspecified glaucoma: Secondary | ICD-10-CM | POA: Diagnosis not present

## 2013-02-18 ENCOUNTER — Ambulatory Visit: Payer: Medicare Other | Admitting: Oncology

## 2013-02-18 ENCOUNTER — Other Ambulatory Visit: Payer: Medicare Other | Admitting: Lab

## 2013-02-18 ENCOUNTER — Telehealth: Payer: Self-pay | Admitting: *Deleted

## 2013-02-18 NOTE — Telephone Encounter (Signed)
ok 

## 2013-02-18 NOTE — Telephone Encounter (Signed)
Pt called to cancel her appt for today because she is sick and wanted to rs. gv appt for 02/24/13 w/labs@ 1:45pm and ov@ 2:15pm. Pt is aware...td

## 2013-02-24 ENCOUNTER — Ambulatory Visit (HOSPITAL_COMMUNITY)
Admission: RE | Admit: 2013-02-24 | Discharge: 2013-02-24 | Disposition: A | Discharge status: Home / Self Care | Payer: Medicare Other | Source: Ambulatory Visit | Attending: Oncology | Admitting: Oncology

## 2013-02-24 ENCOUNTER — Telehealth: Payer: Self-pay | Admitting: Oncology

## 2013-02-24 ENCOUNTER — Other Ambulatory Visit (HOSPITAL_BASED_OUTPATIENT_CLINIC_OR_DEPARTMENT_OTHER): Payer: Medicare Other | Admitting: Lab

## 2013-02-24 ENCOUNTER — Encounter: Payer: Self-pay | Admitting: Oncology

## 2013-02-24 ENCOUNTER — Ambulatory Visit (HOSPITAL_BASED_OUTPATIENT_CLINIC_OR_DEPARTMENT_OTHER): Payer: Medicare Other | Admitting: Oncology

## 2013-02-24 VITALS — BP 102/67 | HR 68 | Temp 98.6°F | Resp 18 | Ht 59.0 in | Wt 136.7 lb

## 2013-02-24 DIAGNOSIS — M25519 Pain in unspecified shoulder: Secondary | ICD-10-CM

## 2013-02-24 DIAGNOSIS — C50919 Malignant neoplasm of unspecified site of unspecified female breast: Secondary | ICD-10-CM

## 2013-02-24 DIAGNOSIS — M25512 Pain in left shoulder: Secondary | ICD-10-CM

## 2013-02-24 LAB — CBC WITH DIFFERENTIAL/PLATELET
Basophils Absolute: 0 10*3/uL (ref 0.0–0.1)
Eosinophils Absolute: 0.2 10*3/uL (ref 0.0–0.5)
HGB: 13.7 g/dL (ref 11.6–15.9)
LYMPH%: 26.2 % (ref 14.0–49.7)
MCV: 90.6 fL (ref 79.5–101.0)
MONO#: 0.4 10*3/uL (ref 0.1–0.9)
NEUT#: 3.2 10*3/uL (ref 1.5–6.5)
Platelets: 147 10*3/uL (ref 145–400)
RBC: 4.49 10*6/uL (ref 3.70–5.45)
RDW: 12.1 % (ref 11.2–14.5)
WBC: 5.3 10*3/uL (ref 3.9–10.3)

## 2013-02-24 LAB — COMPREHENSIVE METABOLIC PANEL (CC13)
Albumin: 3.3 g/dL — ABNORMAL LOW (ref 3.5–5.0)
BUN: 12.3 mg/dL (ref 7.0–26.0)
CO2: 27 mEq/L (ref 22–29)
Glucose: 84 mg/dl (ref 70–140)
Potassium: 4.4 mEq/L (ref 3.5–5.1)
Sodium: 142 mEq/L (ref 136–145)
Total Protein: 7 g/dL (ref 6.4–8.3)

## 2013-02-24 NOTE — Patient Instructions (Addendum)
Continue tamoxifen 20 mg daily  Left shoulder: x-ray and refer to physical thrapy  We will see you back in 6 months

## 2013-02-24 NOTE — Telephone Encounter (Signed)
Pt sent to [facility] rad and given appt schedule for March 2015. S/w Harriett Sine at lymphedema clinic re referral. Clinic will call pt - pt aware.

## 2013-02-24 NOTE — Progress Notes (Signed)
[facility]  Telephone:([phone] Fax:([phone]) [phone]  OFFICE PROGRESS NOTE  PATIENT: Jessica Ashley   DOB: 18-Sep-1934  MR#: [id number]  JYN#:[id number]  QM:VHQI,ONGEXBM ALLEN, MD [name]. Derrell Lolling, MD  DIAGNOSIS:  78 year old [place] female with invasive ductal carcinoma of the left breast that was diagnosed in 2011.  PRIOR THERAPY: 1. Status post left breast lumpectomy with sentinel biopsy on 07/16/2009. ER 98%, PR 100%, Ki-67 8%, HER-2/neu by C[facility] no amplification with a ratio of 1.40.  2. Status post radiation therapy from 08/26/2009 through 10/06/2009.  3. Adjuvant antiestrogen therapy with Arimidex/Letrozole/Aromasin were all attempted, but the patient did not tolerate any of them well.  CURRENT THERAPY:  Tamoxifen 20 mg by mouth daily since 09/2009.     INTERVAL HISTORY:  Jessica Ashley today for follow up of invasive ductal carcinoma. Clinically patient seems to continue to tolerate tamoxifen well without any significant problems. She has approximately 2-3 more years of this remaining. She is not having any hot flashes no aches pains no fevers chills night sweats. She denies any vaginal bleeding no breast tenderness. Patient does tell me that she's had left shoulder pain she is not able to raise that without pain. This is concerning because she has had surgery on the left side with sentinel nodes. I have recommended that she be seen by physical therapy and also I will obtain x-rays of the left shoulder. She's not had any bleeding problems no easy bruising. Remainder of the 10 point review of systems is negative.   PAST MEDICAL HISTORY: Past Medical History  Diagnosis Date  . Cancer   . Cataract   . Glaucoma   . Thyroid disease     thyroid surgery    PAST SURGICAL HISTORY: Past Surgical History  Procedure Laterality Date  . Cholecystectomy  2011  . Breast lumpectomy  2011  . Tonsillectomy  1953  . Dilation and curettage, diagnostic / therapeutic  1993     FAMILY HISTORY: Family History  Problem Relation Age of Onset  . Cancer Sister 19    bladder  . Pneumonia Mother   . Heart disease Mother     SOCIAL HISTORY: History  Substance Use Topics  . Smoking status: Never Smoker   . Smokeless tobacco: Never Used  . Alcohol Use: No    ALLERGIES: Allergies  Allergen Reactions  . Chlorhexidine Gluconate Swelling and Rash  . Sulfur Swelling    Face and possibly entire body if allowed without intervention.  Marland Kitchen Dextroamphetamine Sulfate     Per patient she is unaware of reaction to this medication.  . Sulfa Antibiotics Swelling  . Penicillins Rash    Where injected     MEDICATIONS:  Current Outpatient Prescriptions  Medication Sig Dispense Refill  . aspirin 81 MG tablet Take 81 mg by mouth daily.        . Calcium Carbonate-Vitamin D (CALCIUM-VITAMIN D3 PO) Take 1 tablet by mouth daily.       . Cholecalciferol (VITAMIN D) 2000 UNITS CAPS Take by mouth.        . docusate sodium (COLACE) 50 MG capsule Take by mouth daily.        Marland Kitchen OVER THE COUNTER MEDICATION Take 1 capsule by mouth every other day. "fiber pill"      . sodium chloride (MURO 128) 5 % ophthalmic solution 1 drop daily.      . tamoxifen (NOLVADEX) 20 MG tablet Take 1 tablet (20 mg total) by mouth daily.  30 tablet  24  . TRAVATAN Z 0.004 % SOLN ophthalmic solution        No current facility-administered medications for this visit.      REVIEW OF SYSTEMS: A 10 point review of systems was completed and is negative except as noted above.    PHYSICAL EXAMINATION: BP 102/67  Pulse 68  Temp(Src) 98.6 F (37 C) (Oral)  Resp 18  Ht 4\' 11"  (1.499 m)  Wt 136 lb 11.2 oz (62.007 kg)  BMI 27.6 kg/m2   General appearance: Alert, cooperative, well nourished, no apparent distress Head: Normocephalic, without obvious abnormality, atraumatic Eyes: Arcus senilis, PERRLA, EOMI Nose: Nares, septum and mucosa are normal, no drainage or sinus tenderness. Neck: No adenopathy,  supple, symmetrical, trachea midline, thyroid not enlarged, no tenderness Resp: Clear to auscultation bilaterally Cardio: Regular rate and rhythm, S1, S2 normal, no murmur, click, rub or gallop Breasts: Soft bilaterally, pendulous, left breast well-healed surgical scar, visible radiation changes, no lymphadenopathy, no nipple inversion, no axilla fullness, benign breast exam GI: Soft, distended, non-tender, hypoactive bowel sounds, no organomegaly Skin:  Scattered nevi on trunk area Extremities: Extremities normal, atraumatic, no cyanosis or edema Lymph nodes: Cervical, supraclavicular, and axillary nodes normal Neurologic: Grossly normal    ECOG FS:  Grade 1 - Symptomatic but completely ambulatory   LAB RESULTS: Lab Results  Component Value Date   WBC 5.3 02/24/2013   NEUTROABS 3.2 02/24/2013   HGB 13.7 02/24/2013   HCT 40.7 02/24/2013   MCV 90.6 02/24/2013   PLT 147 02/24/2013      Chemistry      Component Value Date/Time   NA 144 07/18/2012 1224   NA 141 11/21/2011 1327   K 4.1 07/18/2012 1224   K 4.5 11/21/2011 1327   CL 105 07/18/2012 1224   CL 107 11/21/2011 1327   CO2 28 07/18/2012 1224   CO2 29 11/21/2011 1327   BUN 14.3 07/18/2012 1224   BUN 14 11/21/2011 1327   CREATININE 0.7 07/18/2012 1224   CREATININE 0.79 11/21/2011 1327      Component Value Date/Time   CALCIUM 9.5 07/18/2012 1224   CALCIUM 9.0 11/21/2011 1327   ALKPHOS 49 07/18/2012 1224   ALKPHOS 44 11/21/2011 1327   AST 19 07/18/2012 1224   AST 19 11/21/2011 1327   ALT 15 07/18/2012 1224   ALT 11 11/21/2011 1327   BILITOT 0.32 07/18/2012 1224   BILITOT 0.3 11/21/2011 1327      Lab Results  Component Value Date   LABCA2 15 05/19/2011     RADIOGRAPHIC STUDIES: No results found.   ASSESSMENT: 78 y.o. woman with: 1. T1c N0 left breast invasive ductal carcinoma diagnosed in 2011, status post lumpectomy with sentinel biopsy on 07/16/2009, status post radiation therapy completed on 10/06/2009 adjuvant antiestrogen  therapy since 09/2009.  #2 left shoulder: We will obtain x-ray and refer her to cancer rehabilitation clinic for physical therapy.  PLAN: 1. The patient will continue with Tamoxifen 20 mg by mouth daily until 09/2014.  #2 x-ray of the left shoulder and referral to physical therapy.  #3 I will see her back in 6 months time for followup.   All questions were answered.  The patient was encouraged to contact us in the interim with any problems, questions or concerns. The length of time of the face-to-face encounter was 25    minutes. More than 50% of time was spent counseling and coordination of care.  Drue Second, MD Medical/Oncology [facility] [phone] (beeper) [phone] (  Office)  02/24/2013, 2:27 PM

## 2013-03-05 ENCOUNTER — Ambulatory Visit (INDEPENDENT_AMBULATORY_CARE_PROVIDER_SITE_OTHER): Payer: Medicare Other

## 2013-03-05 DIAGNOSIS — Z23 Encounter for immunization: Secondary | ICD-10-CM | POA: Diagnosis not present

## 2013-03-06 ENCOUNTER — Ambulatory Visit: Payer: Medicare Other | Attending: Oncology | Admitting: Physical Therapy

## 2013-03-06 DIAGNOSIS — IMO0001 Reserved for inherently not codable concepts without codable children: Secondary | ICD-10-CM | POA: Insufficient documentation

## 2013-03-06 DIAGNOSIS — R29898 Other symptoms and signs involving the musculoskeletal system: Secondary | ICD-10-CM | POA: Diagnosis not present

## 2013-03-06 DIAGNOSIS — M25519 Pain in unspecified shoulder: Secondary | ICD-10-CM | POA: Insufficient documentation

## 2013-03-06 DIAGNOSIS — C50919 Malignant neoplasm of unspecified site of unspecified female breast: Secondary | ICD-10-CM | POA: Diagnosis not present

## 2013-03-12 ENCOUNTER — Ambulatory Visit: Payer: Medicare Other

## 2013-03-12 DIAGNOSIS — C50919 Malignant neoplasm of unspecified site of unspecified female breast: Secondary | ICD-10-CM | POA: Diagnosis not present

## 2013-03-12 DIAGNOSIS — M25519 Pain in unspecified shoulder: Secondary | ICD-10-CM | POA: Diagnosis not present

## 2013-03-12 DIAGNOSIS — R29898 Other symptoms and signs involving the musculoskeletal system: Secondary | ICD-10-CM | POA: Diagnosis not present

## 2013-03-12 DIAGNOSIS — IMO0001 Reserved for inherently not codable concepts without codable children: Secondary | ICD-10-CM | POA: Diagnosis not present

## 2013-03-17 ENCOUNTER — Ambulatory Visit: Payer: Medicare Other

## 2013-03-17 DIAGNOSIS — C50919 Malignant neoplasm of unspecified site of unspecified female breast: Secondary | ICD-10-CM | POA: Diagnosis not present

## 2013-03-17 DIAGNOSIS — IMO0001 Reserved for inherently not codable concepts without codable children: Secondary | ICD-10-CM | POA: Diagnosis not present

## 2013-03-17 DIAGNOSIS — R29898 Other symptoms and signs involving the musculoskeletal system: Secondary | ICD-10-CM | POA: Diagnosis not present

## 2013-03-17 DIAGNOSIS — M25519 Pain in unspecified shoulder: Secondary | ICD-10-CM | POA: Diagnosis not present

## 2013-03-19 ENCOUNTER — Ambulatory Visit: Payer: Medicare Other

## 2013-03-19 DIAGNOSIS — R29898 Other symptoms and signs involving the musculoskeletal system: Secondary | ICD-10-CM | POA: Diagnosis not present

## 2013-03-19 DIAGNOSIS — M25519 Pain in unspecified shoulder: Secondary | ICD-10-CM | POA: Diagnosis not present

## 2013-03-19 DIAGNOSIS — C50919 Malignant neoplasm of unspecified site of unspecified female breast: Secondary | ICD-10-CM | POA: Diagnosis not present

## 2013-03-19 DIAGNOSIS — IMO0001 Reserved for inherently not codable concepts without codable children: Secondary | ICD-10-CM | POA: Diagnosis not present

## 2013-03-24 ENCOUNTER — Ambulatory Visit: Payer: Medicare Other | Admitting: Physical Therapy

## 2013-03-24 DIAGNOSIS — C50919 Malignant neoplasm of unspecified site of unspecified female breast: Secondary | ICD-10-CM | POA: Diagnosis not present

## 2013-03-24 DIAGNOSIS — M25519 Pain in unspecified shoulder: Secondary | ICD-10-CM | POA: Diagnosis not present

## 2013-03-24 DIAGNOSIS — R29898 Other symptoms and signs involving the musculoskeletal system: Secondary | ICD-10-CM | POA: Diagnosis not present

## 2013-03-24 DIAGNOSIS — IMO0001 Reserved for inherently not codable concepts without codable children: Secondary | ICD-10-CM | POA: Diagnosis not present

## 2013-03-27 ENCOUNTER — Ambulatory Visit: Payer: Medicare Other | Admitting: Physical Therapy

## 2013-03-27 DIAGNOSIS — R29898 Other symptoms and signs involving the musculoskeletal system: Secondary | ICD-10-CM | POA: Diagnosis not present

## 2013-03-27 DIAGNOSIS — M25519 Pain in unspecified shoulder: Secondary | ICD-10-CM | POA: Diagnosis not present

## 2013-03-27 DIAGNOSIS — IMO0001 Reserved for inherently not codable concepts without codable children: Secondary | ICD-10-CM | POA: Diagnosis not present

## 2013-03-27 DIAGNOSIS — C50919 Malignant neoplasm of unspecified site of unspecified female breast: Secondary | ICD-10-CM | POA: Diagnosis not present

## 2013-03-31 ENCOUNTER — Ambulatory Visit: Payer: Medicare Other | Attending: Oncology | Admitting: Physical Therapy

## 2013-03-31 DIAGNOSIS — M25519 Pain in unspecified shoulder: Secondary | ICD-10-CM | POA: Diagnosis not present

## 2013-03-31 DIAGNOSIS — IMO0001 Reserved for inherently not codable concepts without codable children: Secondary | ICD-10-CM | POA: Diagnosis not present

## 2013-03-31 DIAGNOSIS — R29898 Other symptoms and signs involving the musculoskeletal system: Secondary | ICD-10-CM | POA: Insufficient documentation

## 2013-03-31 DIAGNOSIS — C50919 Malignant neoplasm of unspecified site of unspecified female breast: Secondary | ICD-10-CM | POA: Diagnosis not present

## 2013-04-02 ENCOUNTER — Ambulatory Visit: Payer: Medicare Other

## 2013-04-07 ENCOUNTER — Ambulatory Visit: Payer: Medicare Other

## 2013-04-09 ENCOUNTER — Ambulatory Visit: Payer: Medicare Other

## 2013-04-14 ENCOUNTER — Ambulatory Visit: Payer: Medicare Other

## 2013-04-16 ENCOUNTER — Ambulatory Visit: Payer: Medicare Other

## 2013-06-09 ENCOUNTER — Other Ambulatory Visit (INDEPENDENT_AMBULATORY_CARE_PROVIDER_SITE_OTHER): Payer: Self-pay | Admitting: General Surgery

## 2013-06-09 DIAGNOSIS — Z853 Personal history of malignant neoplasm of breast: Secondary | ICD-10-CM

## 2013-06-23 ENCOUNTER — Ambulatory Visit
Admission: RE | Admit: 2013-06-23 | Discharge: 2013-06-23 | Disposition: A | Discharge status: Home / Self Care | Payer: Medicare Other | Source: Ambulatory Visit | Attending: General Surgery | Admitting: General Surgery

## 2013-06-23 DIAGNOSIS — Z853 Personal history of malignant neoplasm of breast: Secondary | ICD-10-CM

## 2013-07-25 DIAGNOSIS — H4011X0 Primary open-angle glaucoma, stage unspecified: Secondary | ICD-10-CM | POA: Diagnosis not present

## 2013-07-25 DIAGNOSIS — H409 Unspecified glaucoma: Secondary | ICD-10-CM | POA: Diagnosis not present

## 2013-08-20 ENCOUNTER — Other Ambulatory Visit: Payer: Self-pay | Admitting: Family

## 2013-08-22 ENCOUNTER — Other Ambulatory Visit: Payer: Self-pay | Admitting: *Deleted

## 2013-08-22 DIAGNOSIS — C50919 Malignant neoplasm of unspecified site of unspecified female breast: Secondary | ICD-10-CM

## 2013-08-25 ENCOUNTER — Ambulatory Visit (HOSPITAL_BASED_OUTPATIENT_CLINIC_OR_DEPARTMENT_OTHER): Payer: Medicare Other | Admitting: Oncology

## 2013-08-25 ENCOUNTER — Other Ambulatory Visit (HOSPITAL_BASED_OUTPATIENT_CLINIC_OR_DEPARTMENT_OTHER): Payer: Medicare Other

## 2013-08-25 ENCOUNTER — Other Ambulatory Visit: Payer: Self-pay

## 2013-08-25 ENCOUNTER — Encounter: Payer: Self-pay | Admitting: Oncology

## 2013-08-25 ENCOUNTER — Telehealth: Payer: Self-pay | Admitting: Oncology

## 2013-08-25 VITALS — BP 119/76 | HR 67 | Temp 98.2°F | Resp 18 | Ht 59.0 in | Wt 139.6 lb

## 2013-08-25 DIAGNOSIS — C50919 Malignant neoplasm of unspecified site of unspecified female breast: Secondary | ICD-10-CM

## 2013-08-25 DIAGNOSIS — Z170 Estrogen receptor positive status [ER+]: Secondary | ICD-10-CM

## 2013-08-25 LAB — CBC WITH DIFFERENTIAL/PLATELET
BASO%: 0.4 % (ref 0.0–2.0)
BASOS ABS: 0 10*3/uL (ref 0.0–0.1)
EOS ABS: 0.3 10*3/uL (ref 0.0–0.5)
EOS%: 4.8 % (ref 0.0–7.0)
HEMATOCRIT: 40.8 % (ref 34.8–46.6)
HEMOGLOBIN: 13.4 g/dL (ref 11.6–15.9)
LYMPH%: 28.5 % (ref 14.0–49.7)
MCH: 29.9 pg (ref 25.1–34.0)
MCHC: 32.8 g/dL (ref 31.5–36.0)
MCV: 91.3 fL (ref 79.5–101.0)
MONO#: 0.6 10*3/uL (ref 0.1–0.9)
MONO%: 11.1 % (ref 0.0–14.0)
NEUT#: 2.9 10*3/uL (ref 1.5–6.5)
NEUT%: 55.2 % (ref 38.4–76.8)
PLATELETS: 129 10*3/uL — AB (ref 145–400)
RBC: 4.47 10*6/uL (ref 3.70–5.45)
RDW: 12.2 % (ref 11.2–14.5)
WBC: 5.2 10*3/uL (ref 3.9–10.3)
lymph#: 1.5 10*3/uL (ref 0.9–3.3)

## 2013-08-25 LAB — COMPREHENSIVE METABOLIC PANEL (CC13)
ALT: 10 U/L (ref 0–55)
ANION GAP: 9 meq/L (ref 3–11)
AST: 18 U/L (ref 5–34)
Albumin: 3.5 g/dL (ref 3.5–5.0)
Alkaline Phosphatase: 42 U/L (ref 40–150)
BILIRUBIN TOTAL: 0.28 mg/dL (ref 0.20–1.20)
BUN: 12.4 mg/dL (ref 7.0–26.0)
CO2: 27 meq/L (ref 22–29)
CREATININE: 0.8 mg/dL (ref 0.6–1.1)
Calcium: 9.1 mg/dL (ref 8.4–10.4)
Chloride: 109 mEq/L (ref 98–109)
Glucose: 87 mg/dl (ref 70–140)
Potassium: 4.3 mEq/L (ref 3.5–5.1)
Sodium: 144 mEq/L (ref 136–145)
Total Protein: 6.9 g/dL (ref 6.4–8.3)

## 2013-08-25 MED ORDER — TAMOXIFEN CITRATE 20 MG PO TABS: 20.0000 mg | ORAL_TABLET | Freq: Every day | ORAL | Status: DC

## 2013-08-25 NOTE — Patient Instructions (Signed)
Continue tamoxifen 20 mg daily  We will see you back in 6 months for follow up  Breast Cancer Survivor Follow-Up Breast cancer begins when cells in the breast divide too rapidly. The extra cells form a lump (tumor). When the cancer is treated, the goal is to get rid of all cancer cells. However, sometimes a few cells survive. These cancer cells can then grow. They become recurrent cancer. This means the cancer comes back after treatment.  Most cases of recurrent breast cancer develop 3 to 5 years after treatment. However, sometimes it comes back just a few months after treatment. Other times, it does not come back until years later. If the cancer comes back in the same area as the first breast cancer, it is called a local recurrence. If the cancer comes back somewhere else in the body, it is called regional recurrence if the site is fairly near the breast or distant recurrence if it is far from the breast. Your caregiver may also use the term metastasize to indicate a cancer that has gone to another part of your body. Treatment is still possible after either kind of recurrence. The cancer can still be controlled.  CAUSES OF RECURRENT CANCER No one knows exactly why breast cancer starts in the first place. Why the cancer comes back after treatment is also not clear. It is known that certain conditions, called risk factors, can make this more likely. They include:  Developing breast cancer for the first time before age 68.  Having breast cancer that involves the lymph nodes. These are small, round pieces of tissue found all over the body. Their job is to help fight infections.  Having a large tumor. Cancer is more apt to come back if the first tumor was bigger than 2 inches (5 cm).  Having certain types of breast cancer, such as:  Inflammatory breast cancer. This rare type grows rapidly and causes the breast to become red and swollen.  A high-grade tumor. The grade of a tumor indicates how fast it  will grow and spread. High-grade tumors grow more quickly than other types.  HER2 cancer. This refers to the tumor's genetic makeup. Tumors that have this type of gene are more likely to come back after treatment.  Having close tumor margins. This refers to the space between the tumor and normal, noncancerous cells. If the space is small, the tumor has a greater chance of coming back.  Having treatment involving a surgery to remove the tumor but not the entire breast (lumpectomy) and no radiation therapy. CARE AFTER BREAST CANCER Home Monitoring Women who have had breast cancer should continue to examine their breasts every month. The goal is to catch the cancer quickly if it comes back. Many women find it helpful to do so on the same day each month and to mark the calendar as a reminder. Let your caregiver know immediately if you have any signs of recurrent breast cancer. Symptoms will vary, depending on where the cancer recurs. The original type of treatment can also make a difference. Symptoms of local recurrence after a lumpectomy or a recurrence in the opposite breast may include:  A new lump or thickening in the breast.  A change in the way the skin looks on the breast (such as a rash, dimpling, or wrinkling).  Redness or swelling of the breast.  Changes in the nipple (such as being red, puckered, swollen, or leaking fluid). Symptoms of a recurrence after a breast removal surgery (mastectomy)  may include:  A lump or thickening under the skin.  A thickening around the mastectomy scar. Symptoms of regional recurrence in the lymph nodes near the breast may include:  A lump under the arm or above the collarbone.  Swelling of the arm.  Pain in the arm, shoulder, or chest.  Numbness in the hand or arm. Symptoms of distant recurrence may include:  A cough that does not go away.  Trouble breathing or shortness of breath.  Pain in the bones or the chest. This is pain that lasts or  does not respond to rest and medicine.  Headaches.  Sudden vision problems.  Dizziness.  Nausea or vomiting.  Losing weight without trying to.  Persistent abdominal pain.  Changes in bowel movements or blood in the stool.  Yellowing of the skin or eyes (jaundice).  Blood in the urine or bloody vaginal discharge. Clinical Monitoring  It is helpful to keep a schedule of appointments for needed tests and exams. This includes physical exams, breast exams, exams of the lymph nodes, and general exams.  For the first 3 years after being treated for breast cancer, see your caregiver every 3 to 6 months.  For years 4 and 5 after breast cancer, see your caregiver every 6 to 12 months.  After 5 years, see your caregiver at least once a year.  Regular breast X-rays (mammograms) should continue even if you had a mastectomy.  A mammogram should be done 1 year after the mammogram that first detected breast cancer.  A mammogram should be done every 6 to 12 months after that. Follow your caregiver's advice.  A pelvic exam done by your caregiver checks whether female organs are the normal size and shape. The exam is usually done every year. Ask your caregiver if that schedule is right for you.  Women taking tamoxifen should report any vaginal bleeding immediately to their caregiver. Tamoxifen is often given to women with a certain type of breast cancer. It has been shown to help prevent recurrence.  You will need to decide who your primary caregiver will be.  Most people continue to see their cancer specialist (oncologist) every 3 to 6 months for the first year after cancer treatment.  At some point, you may want to go back to seeing your family caregiver. You would no longer see your oncologist for regular checkups. Many women do this about 1 year after their first diagnosis of breast cancer.  You will still need to be seen every so often by your oncologist. Ask how often that should be.  Coordinate this with your family or primary caregiver.  Think about having genetic counseling. This would provide information on traits that can be passed or inherited from one generation to the next. In some cases, breast cancer runs in families. Tell your caregiver if you:  Are of Ashkenazi Jewish heritage.  Have any family member who has had ovarian cancer.  Have a mother, sister, or daughter who had breast cancer before age 22.  Have 2 or more close relatives who have had breast cancer. This means a mother, sister, daughter, aunt, or grandmother.  Had breast cancer in both breasts.  Have a female relative who has had breast cancer.  Some tests are not recommended for routine screening. Someone recovering from breast cancer does not need to have these tests if there are no problems. The tests have risks, such as radiation exposure, and can be costly. The risks of these tests are thought to  be greater than the benefits:  Blood tests.  Chest X-rays.  Bone scans.  Liver ultrasound.  Computed tomography (CT scan).  Positron emission tomography (PET scan).  Magnetic resonance imaging (MRI scan). DIAGNOSIS OF RECURRENT CANCER Recurrent breast cancer may be suspected for various reasons. A mammogram may not look normal. You might feel a lump or have other symptoms. Your caregiver may find something unusual during an exam. To be sure, your caregiver will probably order some tests. The tests are needed because there are symptoms or hints of a problem. They could include:  Blood tests, including a test to check how well the liver is working. The liver is a common site for a distant cancer recurrence.  Imaging tests that create pictures of the inside of the body. These tests include:  Chest X-rays to show if the cancer has come back in the lungs.  CT scans to create detailed pictures of various areas of the body and help find a distant recurrence.  MRI scans to find anything unusual  in the breast, chest, or lymph nodes.  Breast ultrasound tests to examine the breasts.  Bone scans to create a picture of your whole skeleton and find cancer in bony areas.  PET scans to create an image of the whole body. PET scans can be used together with CT scans to show more detail.  Biopsy. A small sample of tissue is taken and checked under a microscope. If cancer cells are found, they may be tested to see if they contain the HER2 gene or the hormones estrogen and progesterone. This will help your caregiver decide how to treat the recurrent cancer. TREATMENT  How recurrent breast cancer is treated depends on where the new cancer is found. The type of treatment that was used for the first breast cancer makes a difference, too. A combination of treatments may be used. Options include:  Surgery.  If the cancer comes back in the breast that was not treated before, you may need a lumpectomy or mastectomy.  If the cancer comes back in the breast that was treated before, you may need a mastectomy.  The lymph nodes under the arm may need to be removed.  Radiation therapy.  For a local recurrence, radiation may be used if it was not used during the first treatment.  For a distance recurrence, radiation is sometimes used.  Chemotherapy.  This may be used before surgery to treat recurrent breast cancer.  This may be used to treat recurrent cancer that cannot be treated with surgery.  This may be used to treat a distant recurrence.  Hormone therapy.  Women with the HER2 gene may be given hormone therapy to attack this gene. Document Released: 01/11/2011 Document Revised: 08/07/2011 Document Reviewed: 01/11/2011 [facility] Patient Information 2014 [place], [place].

## 2013-08-25 NOTE — Telephone Encounter (Signed)
gve the pt her oct 2015 appt calendar

## 2013-08-25 NOTE — Telephone Encounter (Signed)
Refill request while pt in ofc.  Verified with pharmacy scrip not received.  Refill sent.

## 2013-08-26 NOTE — Progress Notes (Signed)
[place] OFFICE PROGRESS NOTE  Patient Care Team: Dorena Cookey, MD as PCP - [place] M. Dalbert Batman, MD  DIAGNOSIS: 79 year old [place] female with invasive ductal carcinoma of the left breast that was diagnosed in 2011.   SUMMARY OF ONCOLOGIC HISTORY: . Status post left breast lumpectomy with sentinel biopsy on 07/16/2009. ER 98%, PR 100%, Ki-67 8%, HER-2/neu by CISH no amplification with a ratio of 1.40.  2. Status post radiation therapy from 08/26/2009 through 10/06/2009.  3. Adjuvant antiestrogen therapy with Arimidex/Letrozole/Aromasin were all attempted, but the patient did not tolerate any of them well.   CURRENT THERAPY: Tamoxifen 20 mg by mouth daily since 09/2009.   INTERVAL HISTORY: Jessica Ashley 79 y.o. female returns for followup visit today. She had history of stage I breast cancer underwent lumpectomy and sentinel lymph node biopsy in 2011. She is currently receiving tamoxifen 20 mg daily. She could not tolerate any of the aromatase inhibitors. She seems to be tolerating tamoxifen quite nicely with minimal side effects. Not complaining of any fevers chills night sweats no shortness of breath chest pains palpitations she has no breast tenderness vaginal bleeding. She does have ongoing left shoulder pain. She's had physical therapy in the past. She also has exercises that she can do at home. She tells me she just does not do them very well. She denies any bruising or bleeding. She has not noticed any masses in her breasts. Last mammogram was on 06/23/2013. Showed no evidence of recurrent disease.  I have reviewed the past medical history, past surgical history, social history and family history with the patient and they are unchanged from previous note.  ALLERGIES:  is allergic to chlorhexidine gluconate; sulfur; dextroamphetamine sulfate; sulfa antibiotics; and penicillins.  MEDICATIONS:  Current Outpatient Prescriptions  Medication Sig Dispense  Refill  . aspirin 81 MG tablet Take 81 mg by mouth daily.        . Calcium Carbonate-Vitamin D (CALCIUM-VITAMIN D3 PO) Take 1 tablet by mouth daily.       . Cholecalciferol (VITAMIN D) 2000 UNITS CAPS Take by mouth.        . docusate sodium (COLACE) 50 MG capsule Take by mouth daily.        Marland Kitchen OVER THE COUNTER MEDICATION Take 1 capsule by mouth every other day. "fiber pill"      . timolol (TIMOPTIC) 0.5 % ophthalmic solution       . sodium chloride (MURO 128) 5 % ophthalmic solution 1 drop daily.      . tamoxifen (NOLVADEX) 20 MG tablet Take 1 tablet (20 mg total) by mouth daily.  30 tablet  12  . TRAVATAN Z 0.004 % SOLN ophthalmic solution        No current facility-administered medications for this visit.    REVIEW OF SYSTEMS:   Constitutional: Denies fevers, chills or abnormal weight loss Eyes: Denies blurriness of vision Ears, nose, mouth, throat, and face: Denies mucositis or sore throat Respiratory: Denies cough, dyspnea or wheezes Cardiovascular: Denies palpitation, chest discomfort or lower extremity swelling Gastrointestinal:  Denies nausea, heartburn or change in bowel habits Skin: Denies abnormal skin rashes Lymphatics: Denies new lymphadenopathy or easy bruising Neurological:Denies numbness, tingling or new weaknesses Behavioral/Psych: Mood is stable, no new changes  All other systems were reviewed with the patient and are negative.  PHYSICAL EXAMINATION: ECOG PERFORMANCE STATUS: 1 - Symptomatic but completely ambulatory  Filed Vitals:   08/25/13 1352  BP: 119/76  Pulse: 67  Temp:  98.2 F (36.8 C)  Resp: 18   Filed Weights   08/25/13 1352  Weight: 139 lb 9.6 oz (63.322 kg)    GENERAL:alert, no distress and comfortable SKIN: skin color, texture, turgor are normal, no rashes or significant lesions EYES: normal, Conjunctiva are pink and non-injected, sclera clear OROPHARYNX:no exudate, no erythema and lips, buccal mucosa, and tongue normal  NECK: supple, thyroid  normal size, non-tender, without nodularity LYMPH:  no palpable lymphadenopathy in the cervical, axillary or inguinal LUNGS: clear to auscultation and percussion with normal breathing effort HEART: regular rate & rhythm and no murmurs and no lower extremity edema ABDOMEN:abdomen soft, non-tender and normal bowel sounds Musculoskeletal:no cyanosis of digits and no clubbing  NEURO: alert & oriented x 3 with fluent speech, no focal motor/sensory deficits Breast examination: Bilateral breasts are examined there no masses nipple discharge or any skin changes left breast lumpectomy site is well healed.  LABORATORY DATA:  I have reviewed the data as listed    Component Value Date/Time   NA 144 08/25/2013 1323   NA 141 11/21/2011 1327   K 4.3 08/25/2013 1323   K 4.5 11/21/2011 1327   CL 105 07/18/2012 1224   CL 107 11/21/2011 1327   CO2 27 08/25/2013 1323   CO2 29 11/21/2011 1327   GLUCOSE 87 08/25/2013 1323   GLUCOSE 95 07/18/2012 1224   GLUCOSE 110* 11/21/2011 1327   BUN 12.4 08/25/2013 1323   BUN 14 11/21/2011 1327   CREATININE 0.8 08/25/2013 1323   CREATININE 0.79 11/21/2011 1327   CALCIUM 9.1 08/25/2013 1323   CALCIUM 9.0 11/21/2011 1327   PROT 6.9 08/25/2013 1323   PROT 6.4 11/21/2011 1327   ALBUMIN 3.5 08/25/2013 1323   ALBUMIN 3.9 11/21/2011 1327   AST 18 08/25/2013 1323   AST 19 11/21/2011 1327   ALT 10 08/25/2013 1323   ALT 11 11/21/2011 1327   ALKPHOS 42 08/25/2013 1323   ALKPHOS 44 11/21/2011 1327   BILITOT 0.28 08/25/2013 1323   BILITOT 0.3 11/21/2011 1327   GFRNONAA >60 03/14/2010 1015   GFRAA  Value: >60        The eGFR has been calculated using the MDRD equation. This calculation has not been validated in all clinical situations. eGFR's persistently <60 mL/min signify possible Chronic Kidney Disease. 03/14/2010 1015    No results found for this basename: SPEP, UPEP,  kappa and lambda light chains    Lab Results  Component Value Date   WBC 5.2 08/25/2013   NEUTROABS 2.9 08/25/2013   HGB  13.4 08/25/2013   HCT 40.8 08/25/2013   MCV 91.3 08/25/2013   PLT 129* 08/25/2013      Chemistry      Component Value Date/Time   NA 144 08/25/2013 1323   NA 141 11/21/2011 1327   K 4.3 08/25/2013 1323   K 4.5 11/21/2011 1327   CL 105 07/18/2012 1224   CL 107 11/21/2011 1327   CO2 27 08/25/2013 1323   CO2 29 11/21/2011 1327   BUN 12.4 08/25/2013 1323   BUN 14 11/21/2011 1327   CREATININE 0.8 08/25/2013 1323   CREATININE 0.79 11/21/2011 1327      Component Value Date/Time   CALCIUM 9.1 08/25/2013 1323   CALCIUM 9.0 11/21/2011 1327   ALKPHOS 42 08/25/2013 1323   ALKPHOS 44 11/21/2011 1327   AST 18 08/25/2013 1323   AST 19 11/21/2011 1327   ALT 10 08/25/2013 1323   ALT 11 11/21/2011 1327   BILITOT 0.28  08/25/2013 1323   BILITOT 0.3 11/21/2011 1327       RADIOGRAPHIC STUDIES: I have personally reviewed the radiological images as listed and agreed with the findings in the report. No results found.    ASSESSMENT & PLAN:  79 year old female with  #1 stage I (T1 N0) invasive ductal carcinoma of the left breast originally diagnosed in 2000 leptin. Every 2011 patient underwent a lumpectomy with sentinel lymph node biopsy. She received radiation therapy adjuvantly which he completed on 10/06/2009. She has been on antiestrogen therapy now on tamoxifen since May 2011. She seems to be tolerating it well.  #2 surveillance: Patient is up-to-date on her mammograms. She just recently had one performed. Her last DEXA scan recorded as 2012. I have asked her where she gets them. She tells me that she may have these done by Dr. Honor Junes office. I have encouraged her to get this set up as soon as possible.  #3 chronic left shoulder pain: Last visit patient was referred to physical therapy. She does have exercises that I have encouraged her to continue doing to help relieve the pain.  Orders Placed This Encounter  Procedures  . CBC with Differential    Standing Status: Future     Number of Occurrences:       Standing Expiration Date: 08/25/2014  . Comprehensive metabolic panel (Cmet) - [facility]    Standing Status: Future     Number of Occurrences:      Standing Expiration Date: 08/25/2014   All questions were answered. The patient knows to call the clinic with any problems, questions or concerns. No barriers to learning was detected. I spent 15 minutes counseling the patient face to face. The total time spent in the appointment was 25 minutes and more than 50% was on counseling and review of test results and coordination of care.     Marcy Panning, MD 08/26/2013 2:30 PM

## 2013-09-17 ENCOUNTER — Encounter: Payer: Self-pay | Admitting: Family Medicine

## 2013-09-17 ENCOUNTER — Ambulatory Visit (INDEPENDENT_AMBULATORY_CARE_PROVIDER_SITE_OTHER): Payer: Medicare Other | Admitting: Family Medicine

## 2013-09-17 VITALS — BP 104/78 | Temp 98.4°F | Ht 59.75 in | Wt 141.0 lb

## 2013-09-17 DIAGNOSIS — M899 Disorder of bone, unspecified: Secondary | ICD-10-CM

## 2013-09-17 DIAGNOSIS — M949 Disorder of cartilage, unspecified: Secondary | ICD-10-CM

## 2013-09-17 DIAGNOSIS — H409 Unspecified glaucoma: Secondary | ICD-10-CM | POA: Diagnosis not present

## 2013-09-17 DIAGNOSIS — Z23 Encounter for immunization: Secondary | ICD-10-CM | POA: Diagnosis not present

## 2013-09-17 DIAGNOSIS — C50919 Malignant neoplasm of unspecified site of unspecified female breast: Secondary | ICD-10-CM

## 2013-09-17 DIAGNOSIS — M8580 Other specified disorders of bone density and structure, unspecified site: Secondary | ICD-10-CM

## 2013-09-17 LAB — POCT URINALYSIS DIPSTICK
BILIRUBIN UA: NEGATIVE
Blood, UA: NEGATIVE
GLUCOSE UA: NEGATIVE
Ketones, UA: NEGATIVE
NITRITE UA: NEGATIVE
Protein, UA: NEGATIVE
Spec Grav, UA: 1.02
Urobilinogen, UA: 0.2
pH, UA: 5.5

## 2013-09-17 LAB — TSH: TSH: 2.63 u[IU]/mL (ref 0.35–5.50)

## 2013-09-17 NOTE — Progress Notes (Signed)
   Subjective:    Patient ID: Jessica Ashley, female    DOB: [birth year], 79 y.o.   MRN: [id number]  HPI Jessica Ashley is a 79 year old married female nonsmoker who comes in today for a Medicare wellness examination because of a history of breast cancer  4 years ago she was found to have abnormal lesion left breast 9:00 by mammography. It was nonpalpable. She underwent a lumpectomy and postop radiation and now she's on tamoxifen via oncology. She's done well no recurrence.  She uses drops because of a history of glaucoma  She gets routine eye care, dental care, BSE monthly, and you mammography, H. data colonoscopies  Vaccination update about Apolonio Schneiders  Cognitive function normal she does not walk on a daily basis home health safety reviewed no issues identified, no guns in the house, she does have a health care power of attorney and living well   Review of Systems  Constitutional: Negative.   HENT: Negative.   Eyes: Negative.   Respiratory: Negative.   Cardiovascular: Negative.   Gastrointestinal: Negative.   Genitourinary: Negative.   Musculoskeletal: Negative.   Neurological: Negative.   Psychiatric/Behavioral: Negative.        Objective:   Physical Exam  Constitutional: She appears well-developed and well-nourished.  HENT:  Head: Normocephalic and atraumatic.  Right Ear: External ear normal.  Left Ear: External ear normal.  Nose: Nose normal.  Mouth/Throat: Oropharynx is clear and moist.  Eyes: EOM are normal. Pupils are equal, round, and reactive to light.  Neck: Normal range of motion. Neck supple. No thyromegaly present.  Cardiovascular: Normal rate, regular rhythm, normal heart sounds and intact distal pulses.  Exam reveals no gallop and no friction rub.   No murmur heard. Pulmonary/Chest: Effort normal and breath sounds normal.  Abdominal: Soft. Bowel sounds are normal. She exhibits no distension and no mass. There is no tenderness. There is no rebound.  Genitourinary:    Bilateral breast exam shows scar well-healed left breast 6:00. Tattoos from radiation markers  No palpable masses  Musculoskeletal: Normal range of motion.  Lymphadenopathy:    She has no cervical adenopathy.  Neurological: She is alert. She has normal reflexes. No cranial nerve deficit. She exhibits normal muscle tone. Coordination normal.  Skin: Skin is warm and dry.  Total body skin exam normal  Psychiatric: She has a normal mood and affect. Her behavior is normal. Judgment and thought content normal.          Assessment & Plan:  Healthy female  Status post left breast cancer x4 years  History of glaucoma followed by ophthalmology  Overweight

## 2013-09-17 NOTE — Progress Notes (Signed)
Pre visit review using our clinic review tool, if applicable. No additional management support is needed unless otherwise documented below in the visit note. 

## 2013-09-17 NOTE — Patient Instructions (Signed)
Walk 30 minutes daily  Continue current medication  Followup in 1 year sooner if any problems

## 2013-12-24 ENCOUNTER — Ambulatory Visit (INDEPENDENT_AMBULATORY_CARE_PROVIDER_SITE_OTHER): Payer: Medicare Other | Admitting: General Surgery

## 2014-01-12 ENCOUNTER — Ambulatory Visit (INDEPENDENT_AMBULATORY_CARE_PROVIDER_SITE_OTHER): Payer: Medicare Other | Admitting: General Surgery

## 2014-01-12 ENCOUNTER — Encounter (INDEPENDENT_AMBULATORY_CARE_PROVIDER_SITE_OTHER): Payer: Self-pay | Admitting: General Surgery

## 2014-01-12 ENCOUNTER — Other Ambulatory Visit (INDEPENDENT_AMBULATORY_CARE_PROVIDER_SITE_OTHER): Payer: Self-pay

## 2014-01-12 VITALS — BP 108/66 | HR 83 | Temp 97.5°F | Ht 59.0 in | Wt 138.0 lb

## 2014-01-12 DIAGNOSIS — C50912 Malignant neoplasm of unspecified site of left female breast: Secondary | ICD-10-CM

## 2014-01-12 DIAGNOSIS — C50919 Malignant neoplasm of unspecified site of unspecified female breast: Secondary | ICD-10-CM | POA: Diagnosis not present

## 2014-01-12 DIAGNOSIS — D0592 Unspecified type of carcinoma in situ of left breast: Secondary | ICD-10-CM

## 2014-01-12 NOTE — Patient Instructions (Signed)
Examination of your breasts and all the regional lymph nodes today is normal. Your mammograms from January of this year are also normal. There is no evidence of cancer.  The pain that you have in your left shoulder is not explained very well. The x-ray of the left shoulder is negative. I do not think this is cancer.  I recommend that you see an orthopedic surgeon at some point to see if you have a left rotator cuff tear  You will be referred to a new breast oncologist. Continue to take tamoxifen until you see them  Repeat bilateral mammograms in January 2016  Return to see Dr. Dalbert Batman in one year.

## 2014-01-12 NOTE — Progress Notes (Signed)
Patient ID: Jessica Ashley, female   DOB: 1934/06/13, 79 y.o.   MRN: [id number]  History:  This patient underwent left partial mastectomy and sentinel node biopsy on 07/16/2009, postop adjuvant radiation therapy and she is on adjuvant tamoxifen.  Final pathologic stage T1c, N0, receptor positive, HER-2 negative.  She has no new complaints about her breasts and has had no new health problems. She is followed by Dr. Stevie Kern for primary care.  She has some chronic pain and limited range of motion of her left shoulder. An x-ray of her left shoulder was previously done and is normal. She has not seen an orthopedic surgeon. Bilateral mammograms performed on 06/23/2013  look good. No focal abnormality. Category 2.  Past history, family history, social history are documented on the chart, unchanged, and noncontributory except as described above  ROS: 10 system review of systems is negative except as described above.   Exam:  Patient looks well. No distress. Good spirits.  Neck no adenopathy mass or jugular venous distention  Lungs: Clear to auscultation bilaterally  Heart: Regular rate and rhythm. No murmur. No ectopy.  Breasts: Both breasts medium size. Well-healed transverse scar left breast at 9:00. Well-healed left axillary scar. No palpable mass. No other skin changes. Contour, projection and cosmesis very good.  Left shoulder abduction is limited to about 120. Some discomfort in humeral head. No arm swelling. No sensory deficit.  Assessment:  Invasive carcinoma left breast, medial quadrant, pathologic stage T1c., N0, receptor positive, HER-2 negative.  No evidence of recurrence 4 years following left partial mastectomy sentinel node biopsy, adjuvant radiation therapy and ongoing adjuvant tamoxifen therapy  Left shoulder disability. Wonder if she has a chronic rotator cuff tear  Plan: Ambulatory re-referral to medical oncology, since Dr. Marcy Panning has now resigned continue tamoxifen  until medical oncology sees her. Mammograms in 1 year  Return to see Dr. Dalbert Batman in one year.  I recommended that she see an orthopedic surgeon electively and I gave her some names. She will think about this.    Edsel Petrin. Dalbert Batman, M.D., [facility]  General and Minimally invasive Surgery  Breast and Colorectal Surgery  Office: [phone]  Pager: [phone]

## 2014-01-15 ENCOUNTER — Telehealth: Payer: Self-pay | Admitting: *Deleted

## 2014-01-15 NOTE — Telephone Encounter (Signed)
Called pt and confirmed her to see Dr. Lindi Adie on 10/5.  Changed appt.  Emailed Holley Raring and [place] at [facility] to make them aware.

## 2014-01-26 DIAGNOSIS — H52209 Unspecified astigmatism, unspecified eye: Secondary | ICD-10-CM | POA: Diagnosis not present

## 2014-01-26 DIAGNOSIS — H18519 Endothelial corneal dystrophy, unspecified eye: Secondary | ICD-10-CM | POA: Diagnosis not present

## 2014-01-26 DIAGNOSIS — H2510 Age-related nuclear cataract, unspecified eye: Secondary | ICD-10-CM | POA: Diagnosis not present

## 2014-01-26 DIAGNOSIS — H40019 Open angle with borderline findings, low risk, unspecified eye: Secondary | ICD-10-CM | POA: Diagnosis not present

## 2014-02-18 ENCOUNTER — Other Ambulatory Visit: Payer: Self-pay | Admitting: *Deleted

## 2014-02-18 DIAGNOSIS — C50919 Malignant neoplasm of unspecified site of unspecified female breast: Secondary | ICD-10-CM

## 2014-02-18 MED ORDER — TAMOXIFEN CITRATE 20 MG PO TABS: 20.0000 mg | ORAL_TABLET | Freq: Every day | ORAL | Status: DC

## 2014-02-20 ENCOUNTER — Ambulatory Visit (INDEPENDENT_AMBULATORY_CARE_PROVIDER_SITE_OTHER): Payer: Medicare Other

## 2014-02-20 DIAGNOSIS — Z23 Encounter for immunization: Secondary | ICD-10-CM

## 2014-03-02 ENCOUNTER — Ambulatory Visit (HOSPITAL_BASED_OUTPATIENT_CLINIC_OR_DEPARTMENT_OTHER): Payer: Medicare Other | Admitting: Hematology and Oncology

## 2014-03-02 ENCOUNTER — Other Ambulatory Visit: Payer: Medicare Other

## 2014-03-02 ENCOUNTER — Telehealth: Payer: Self-pay | Admitting: Hematology and Oncology

## 2014-03-02 VITALS — BP 108/85 | HR 75 | Temp 98.8°F | Resp 18 | Ht 59.0 in | Wt 136.7 lb

## 2014-03-02 DIAGNOSIS — Z170 Estrogen receptor positive status [ER+]: Secondary | ICD-10-CM | POA: Diagnosis not present

## 2014-03-02 DIAGNOSIS — C50912 Malignant neoplasm of unspecified site of left female breast: Secondary | ICD-10-CM | POA: Diagnosis not present

## 2014-03-02 NOTE — Progress Notes (Signed)
Patient Care Team: Dorena Cookey, MD as PCP - General  DIAGNOSIS: No matching staging information was found for the patient.  SUMMARY OF ONCOLOGIC HISTORY:  . Status post left breast lumpectomy with sentinel biopsy on 07/16/2009. ER 98%, PR 100%, Ki-67 8%, HER-2/neu by [facility] no amplification with a ratio of 1.40.  2. Status post radiation therapy from 08/26/2009 through 10/06/2009.  3. Adjuvant antiestrogen therapy with Arimidex/Letrozole/Aromasin were all attempted, but the patient did not tolerate any of them well.   CURRENT THERAPY: Tamoxifen 20 mg by mouth daily since 09/2009.   CHIEF COMPLIANT: Six-month followup of breast cancer  INTERVAL HISTORY: Jessica Ashley is a 79 year old Caucasian with above-mentioned symptoms of breast cancer treated with lumpectomy radiation and now on hormonal therapy with tamoxifen since 2007. She is tolerating it fairly well other than mild nausea. She had mammograms done in January which were normal. She had recent breast exam with Dr. Dalbert Batman. She denies any other new complaints or concerns.2  REVIEW OF SYSTEMS:   Constitutional: Denies fevers, chills or abnormal weight loss Eyes: Denies blurriness of vision Ears, nose, mouth, throat, and face: Denies mucositis or sore throat Respiratory: Denies cough, dyspnea or wheezes Cardiovascular: Denies palpitation, chest discomfort or lower extremity swelling Gastrointestinal:  Denies nausea, heartburn or change in bowel habits Skin: Denies abnormal skin rashes Lymphatics: Denies new lymphadenopathy or easy bruising Neurological:Denies numbness, tingling or new weaknesses Behavioral/Psych: Mood is stable, no new changes  Breast:  denies any pain or lumps or nodules in either breasts All other systems were reviewed with the patient and are negative.  I have reviewed the past medical history, past surgical history, social history and family history with the patient and they are unchanged from previous  note.  ALLERGIES:  is allergic to chlorhexidine gluconate; sulfur; dextroamphetamine sulfate; sulfa antibiotics; and penicillins.  MEDICATIONS:  Current Outpatient Prescriptions  Medication Sig Dispense Refill  . aspirin 81 MG tablet Take 81 mg by mouth daily.        . Calcium Carbonate-Vitamin D (CALCIUM-VITAMIN D3 PO) Take 1 tablet by mouth daily.       . Cholecalciferol (VITAMIN D) 2000 UNITS CAPS Take by mouth.        Marland Kitchen OVER THE COUNTER MEDICATION Take 1 capsule by mouth every other day. "fiber pill"      . tamoxifen (NOLVADEX) 20 MG tablet Take 1 tablet (20 mg total) by mouth daily.  30 tablet  6  . timolol (TIMOPTIC) 0.5 % ophthalmic solution        No current facility-administered medications for this visit.    PHYSICAL EXAMINATION: ECOG PERFORMANCE STATUS: 0 - Asymptomatic  Filed Vitals:   03/02/14 1033  BP: 108/85  Pulse: 75  Temp: 98.8 F (37.1 C)  Resp: 18   Filed Weights   03/02/14 1033  Weight: 136 lb 11.2 oz (62.007 kg)    GENERAL:alert, no distress and comfortable SKIN: skin color, texture, turgor are normal, no rashes or significant lesions EYES: normal, Conjunctiva are pink and non-injected, sclera clear OROPHARYNX:no exudate, no erythema and lips, buccal mucosa, and tongue normal  NECK: supple, thyroid normal size, non-tender, without nodularity LYMPH:  no palpable lymphadenopathy in the cervical, axillary or inguinal LUNGS: clear to auscultation and percussion with normal breathing effort HEART: regular rate & rhythm and no murmurs and no lower extremity edema ABDOMEN:abdomen soft, non-tender and normal bowel sounds Musculoskeletal:no cyanosis of digits and no clubbing  NEURO: alert & oriented x 3 with fluent speech,  no focal motor/sensory deficits   LABORATORY DATA:  I have reviewed the data as listed   Chemistry      Component Value Date/Time   NA 144 08/25/2013 1323   NA 141 11/21/2011 1327   K 4.3 08/25/2013 1323   K 4.5 11/21/2011 1327   CL  105 07/18/2012 1224   CL 107 11/21/2011 1327   CO2 27 08/25/2013 1323   CO2 29 11/21/2011 1327   BUN 12.4 08/25/2013 1323   BUN 14 11/21/2011 1327   CREATININE 0.8 08/25/2013 1323   CREATININE 0.79 11/21/2011 1327      Component Value Date/Time   CALCIUM 9.1 08/25/2013 1323   CALCIUM 9.0 11/21/2011 1327   ALKPHOS 42 08/25/2013 1323   ALKPHOS 44 11/21/2011 1327   AST 18 08/25/2013 1323   AST 19 11/21/2011 1327   ALT 10 08/25/2013 1323   ALT 11 11/21/2011 1327   BILITOT 0.28 08/25/2013 1323   BILITOT 0.3 11/21/2011 1327       Lab Results  Component Value Date   WBC 5.2 08/25/2013   HGB 13.4 08/25/2013   HCT 40.8 08/25/2013   MCV 91.3 08/25/2013   PLT 129* 08/25/2013   NEUTROABS 2.9 08/25/2013     RADIOGRAPHIC STUDIES: I have personally reviewed the radiology reports and agreed with their findings. No results found.   ASSESSMENT & PLAN:  Breast cancer Left breast cancer status post lumpectomy February 2011 ER 98 was in the appointment was in tears exam he percent HER-2 negative ratio 1.4 as follows lumpectomy radiation and antiestrogen therapy currently on tamoxifen since 2011.  Patient has been tolerating tamoxifen extremely well without any major problems or concerns. She does have some nausea issues but she when she takes carbonated drinks, this nausea does not seem to be a problem.]  2. Surveillance: Annual mammograms to be done in January. Breast exams done recently were normal.  3. Survivorship:Discussed the importance of physical exercise in decreasing the likelihood of breast cancer recurrence. Recommended 30 mins daily 6 days a week of either brisk walking or cycling or swimming. Encouraged patient to eat more fruits and vegetables and decrease red meat. Patient walks 20 minutes every day along with her husband.  Return to clinic in 6 months with lab work to be done ahead of time.    Orders Placed This Encounter  Procedures  . CBC with Differential    Standing Status: Future      Number of Occurrences:      Standing Expiration Date: 03/02/2015  . Comprehensive metabolic panel (Cmet) - [facility]    Standing Status: Future     Number of Occurrences:      Standing Expiration Date: 03/02/2015   The patient has a good understanding of the overall plan. she agrees with it. She will call with any problems that may develop before her next visit here.  I spent 15 minutes counseling the patient face to face. The total time spent in the appointment was 20 minutes and more than 50% was on counseling and review of test results    Rulon Eisenmenger, MD 03/02/2014 10:55 AM

## 2014-03-02 NOTE — Assessment & Plan Note (Signed)
Left breast cancer status post lumpectomy February 2011 ER 98 was in the appointment was in tears exam he percent HER-2 negative ratio 1.4 as follows lumpectomy radiation and antiestrogen therapy currently on tamoxifen since 2011.  Patient has been tolerating tamoxifen extremely well without any major problems or concerns. She does have some nausea issues but she when she takes carbonated drinks, this nausea does not seem to be a problem.]  2. Surveillance: Annual mammograms to be done in January. Breast exams done recently were normal.  3. Survivorship:Discussed the importance of physical exercise in decreasing the likelihood of breast cancer recurrence. Recommended 30 mins daily 6 days a week of either brisk walking or cycling or swimming. Encouraged patient to eat more fruits and vegetables and decrease red meat. Patient walks 20 minutes every day along with her husband.  Return to clinic in 6 months with lab work to be done ahead of time.

## 2014-03-02 NOTE — Telephone Encounter (Signed)
, °

## 2014-06-22 ENCOUNTER — Other Ambulatory Visit: Payer: Self-pay | Admitting: Hematology and Oncology

## 2014-06-22 DIAGNOSIS — Z853 Personal history of malignant neoplasm of breast: Secondary | ICD-10-CM

## 2014-06-22 DIAGNOSIS — Z9889 Other specified postprocedural states: Secondary | ICD-10-CM

## 2014-06-29 ENCOUNTER — Ambulatory Visit
Admission: RE | Admit: 2014-06-29 | Discharge: 2014-06-29 | Disposition: A | Discharge status: Home / Self Care | Payer: Medicare Other | Source: Ambulatory Visit | Attending: Hematology and Oncology | Admitting: Hematology and Oncology

## 2014-06-29 DIAGNOSIS — R928 Other abnormal and inconclusive findings on diagnostic imaging of breast: Secondary | ICD-10-CM | POA: Diagnosis not present

## 2014-06-29 DIAGNOSIS — Z9889 Other specified postprocedural states: Secondary | ICD-10-CM

## 2014-06-29 DIAGNOSIS — Z853 Personal history of malignant neoplasm of breast: Secondary | ICD-10-CM

## 2014-07-27 DIAGNOSIS — H1851 Endothelial corneal dystrophy: Secondary | ICD-10-CM | POA: Diagnosis not present

## 2014-07-27 DIAGNOSIS — H40013 Open angle with borderline findings, low risk, bilateral: Secondary | ICD-10-CM | POA: Diagnosis not present

## 2014-07-27 DIAGNOSIS — H40053 Ocular hypertension, bilateral: Secondary | ICD-10-CM | POA: Diagnosis not present

## 2014-09-01 ENCOUNTER — Ambulatory Visit (HOSPITAL_BASED_OUTPATIENT_CLINIC_OR_DEPARTMENT_OTHER): Payer: Medicare Other | Admitting: Hematology and Oncology

## 2014-09-01 ENCOUNTER — Other Ambulatory Visit (HOSPITAL_BASED_OUTPATIENT_CLINIC_OR_DEPARTMENT_OTHER): Payer: Medicare Other

## 2014-09-01 ENCOUNTER — Telehealth: Payer: Self-pay | Admitting: Hematology and Oncology

## 2014-09-01 VITALS — BP 118/65 | HR 72 | Temp 98.1°F | Resp 18 | Ht 59.0 in | Wt 137.4 lb

## 2014-09-01 DIAGNOSIS — C50912 Malignant neoplasm of unspecified site of left female breast: Secondary | ICD-10-CM

## 2014-09-01 DIAGNOSIS — Z170 Estrogen receptor positive status [ER+]: Secondary | ICD-10-CM | POA: Diagnosis not present

## 2014-09-01 LAB — CBC WITH DIFFERENTIAL/PLATELET
BASO%: 0.2 % (ref 0.0–2.0)
Basophils Absolute: 0 10*3/uL (ref 0.0–0.1)
EOS ABS: 0.2 10*3/uL (ref 0.0–0.5)
EOS%: 4.1 % (ref 0.0–7.0)
HEMATOCRIT: 41.5 % (ref 34.8–46.6)
HGB: 13.8 g/dL (ref 11.6–15.9)
LYMPH#: 1.4 10*3/uL (ref 0.9–3.3)
LYMPH%: 30.9 % (ref 14.0–49.7)
MCH: 30.5 pg (ref 25.1–34.0)
MCHC: 33.3 g/dL (ref 31.5–36.0)
MCV: 91.6 fL (ref 79.5–101.0)
MONO#: 0.6 10*3/uL (ref 0.1–0.9)
MONO%: 13.3 % (ref 0.0–14.0)
NEUT#: 2.4 10*3/uL (ref 1.5–6.5)
NEUT%: 51.5 % (ref 38.4–76.8)
PLATELETS: 121 10*3/uL — AB (ref 145–400)
RBC: 4.53 10*6/uL (ref 3.70–5.45)
RDW: 12.2 % (ref 11.2–14.5)
WBC: 4.6 10*3/uL (ref 3.9–10.3)

## 2014-09-01 LAB — COMPREHENSIVE METABOLIC PANEL (CC13)
ALT: 14 U/L (ref 0–55)
ANION GAP: 8 meq/L (ref 3–11)
AST: 21 U/L (ref 5–34)
Albumin: 3.5 g/dL (ref 3.5–5.0)
Alkaline Phosphatase: 39 U/L — ABNORMAL LOW (ref 40–150)
BUN: 13.8 mg/dL (ref 7.0–26.0)
CALCIUM: 8.9 mg/dL (ref 8.4–10.4)
CHLORIDE: 109 meq/L (ref 98–109)
CO2: 27 meq/L (ref 22–29)
CREATININE: 0.7 mg/dL (ref 0.6–1.1)
EGFR: 77 mL/min/{1.73_m2} — ABNORMAL LOW (ref >90)
Glucose: 92 mg/dl (ref 70–140)
Potassium: 4.5 mEq/L (ref 3.5–5.1)
SODIUM: 144 meq/L (ref 136–145)
TOTAL PROTEIN: 6.7 g/dL (ref 6.4–8.3)
Total Bilirubin: 0.37 mg/dL (ref 0.20–1.20)

## 2014-09-01 NOTE — Assessment & Plan Note (Signed)
Left breast invasive ductal carcinoma diagnosed February 2011 status post lumpectomy stage IA ER/PR positive HER-2 negative received antiestrogen therapy after radiation therapy. Tamoxifen 5 years will complete May 2016.  Tamoxifen toxicities: Patient did not experience any major side effects of tamoxifen. Breast cancer surveillance: 1. Mammogram February 2016 showed calcifications in the left breast and she is scheduled to undergo another mammogram in 6 months 2. Breast exam 09/01/2014 is normal  Return to clinic in 1 year with survivorship nurse practitioner

## 2014-09-01 NOTE — Progress Notes (Signed)
Patient Care Team: Dorena Cookey, MD as PCP - General  DIAGNOSIS: No matching staging information was found for the patient.  SUMMARY OF ONCOLOGIC HISTORY:   Breast cancer, left breast   07/16/2009 Surgery Left breast lumpectomy: ER 98%, PR 100%, Ki-67 8%, HER-2 negative ratio 1.4   08/12/2009 - 09/21/2009 Radiation Therapy Adjuvant radiation therapy   09/26/2009 - 09/01/2014 Anti-estrogen oral therapy Multiple anti-estrogen therapies were tried and finally completed 5 years of tamoxifen    CHIEF COMPLIANT: Follow-up of breast cancer on tamoxifen  INTERVAL HISTORY: Jessica Ashley is a 80 year old lady with above-mentioned history of left breast cancer treated with lumpectomy and radiation and has been on tamoxifen for the past 5 years. She would like to discontinue therapy as of 09/27/2014. She reports no major problems or concerns. She had a recent mammogram in February which showed benign-appearing calcifications.  REVIEW OF SYSTEMS:   Constitutional: Denies fevers, chills or abnormal weight loss Eyes: Denies blurriness of vision Ears, nose, mouth, throat, and face: Denies mucositis or sore throat Respiratory: Denies cough, dyspnea or wheezes Cardiovascular: Denies palpitation, chest discomfort or lower extremity swelling Gastrointestinal:  Denies nausea, heartburn or change in bowel habits Skin: Denies abnormal skin rashes Lymphatics: Denies new lymphadenopathy or easy bruising Neurological:Denies numbness, tingling or new weaknesses Behavioral/Psych: Mood is stable, no new changes  Breast:  denies any pain or lumps or nodules in either breasts All other systems were reviewed with the patient and are negative.  I have reviewed the past medical history, past surgical history, social history and family history with the patient and they are unchanged from previous note.  ALLERGIES:  is allergic to chlorhexidine gluconate; sulfur; dextroamphetamine sulfate; sulfa antibiotics; and  penicillins.  MEDICATIONS:  Current Outpatient Prescriptions  Medication Sig Dispense Refill  . aspirin 81 MG tablet Take 81 mg by mouth daily.      . Calcium Carbonate-Vitamin D (CALCIUM-VITAMIN D3 PO) Take 1 tablet by mouth daily.     . Cholecalciferol (VITAMIN D) 2000 UNITS CAPS Take by mouth.      Marland Kitchen OVER THE COUNTER MEDICATION Take 1 capsule by mouth every other day. "fiber pill"    . tamoxifen (NOLVADEX) 20 MG tablet Take 1 tablet (20 mg total) by mouth daily. 30 tablet 6  . timolol (TIMOPTIC) 0.5 % ophthalmic solution      No current facility-administered medications for this visit.    PHYSICAL EXAMINATION: ECOG PERFORMANCE STATUS: 1 - Symptomatic but completely ambulatory  Filed Vitals:   09/01/14 1404  BP: 118/65  Pulse: 72  Temp: 98.1 F (36.7 C)  Resp: 18   Filed Weights   09/01/14 1404  Weight: 137 lb 6.4 oz (62.324 kg)    GENERAL:alert, no distress and comfortable SKIN: skin color, texture, turgor are normal, no rashes or significant lesions EYES: normal, Conjunctiva are pink and non-injected, sclera clear OROPHARYNX:no exudate, no erythema and lips, buccal mucosa, and tongue normal  NECK: supple, thyroid normal size, non-tender, without nodularity LYMPH:  no palpable lymphadenopathy in the cervical, axillary or inguinal LUNGS: clear to auscultation and percussion with normal breathing effort HEART: regular rate & rhythm and no murmurs and no lower extremity edema ABDOMEN:abdomen soft, non-tender and normal bowel sounds Musculoskeletal:no cyanosis of digits and no clubbing  NEURO: alert & oriented x 3 with fluent speech, no focal motor/sensory deficits BREAST: No palpable masses or nodules in either right or left breasts. No palpable axillary supraclavicular or infraclavicular adenopathy no breast tenderness or nipple discharge. (  exam performed in the presence of a chaperone)  LABORATORY DATA:  I have reviewed the data as listed   Chemistry      Component  Value Date/Time   NA 144 09/01/2014 1342   NA 141 11/21/2011 1327   K 4.5 09/01/2014 1342   K 4.5 11/21/2011 1327   CL 105 07/18/2012 1224   CL 107 11/21/2011 1327   CO2 27 09/01/2014 1342   CO2 29 11/21/2011 1327   BUN 13.8 09/01/2014 1342   BUN 14 11/21/2011 1327   CREATININE 0.7 09/01/2014 1342   CREATININE 0.79 11/21/2011 1327      Component Value Date/Time   CALCIUM 8.9 09/01/2014 1342   CALCIUM 9.0 11/21/2011 1327   ALKPHOS 39* 09/01/2014 1342   ALKPHOS 44 11/21/2011 1327   AST 21 09/01/2014 1342   AST 19 11/21/2011 1327   ALT 14 09/01/2014 1342   ALT 11 11/21/2011 1327   BILITOT 0.37 09/01/2014 1342   BILITOT 0.3 11/21/2011 1327       Lab Results  Component Value Date   WBC 4.6 09/01/2014   HGB 13.8 09/01/2014   HCT 41.5 09/01/2014   MCV 91.6 09/01/2014   PLT 121* 09/01/2014   NEUTROABS 2.4 09/01/2014     RADIOGRAPHIC STUDIES: I have personally reviewed the radiology reports and agreed with their findings. Mammogram February 2016 showed calcifications in the left breast  ASSESSMENT & PLAN:  Breast cancer, left breast Left breast invasive ductal carcinoma diagnosed February 2011 status post lumpectomy stage IA ER/PR positive HER-2 negative received antiestrogen therapy after radiation therapy. Tamoxifen 5 years will complete May 2016.  Tamoxifen toxicities: Patient did not experience any major side effects of tamoxifen. We discussed extensively the results of atlas clinical trial which showed 10 years of tamoxifen as appeared to 5 years. The patient decided to discontinue it after 5 years based on risks and benefits that we discussed.  Breast cancer surveillance: 1. Mammogram February 2016 showed calcifications in the left breast and she is scheduled to undergo another mammogram in 6 months 2. Breast exam 09/01/2014 is normal  Return to clinic in 1 year with survivorship nurse practitioner  No orders of the defined types were placed in this encounter.    The patient has a good understanding of the overall plan. she agrees with it. She will call with any problems that may develop before her next visit here.   Rulon Eisenmenger, MD

## 2014-09-01 NOTE — Telephone Encounter (Signed)
avs printed for patient,inbox to gretchen for survivorship and patient is aware   anne

## 2014-10-13 ENCOUNTER — Encounter: Payer: Self-pay | Admitting: Family Medicine

## 2014-10-13 ENCOUNTER — Ambulatory Visit (INDEPENDENT_AMBULATORY_CARE_PROVIDER_SITE_OTHER): Payer: Medicare Other | Admitting: Family Medicine

## 2014-10-13 VITALS — BP 120/80 | Temp 98.0°F | Ht 59.0 in | Wt 135.0 lb

## 2014-10-13 DIAGNOSIS — Z0000 Encounter for general adult medical examination without abnormal findings: Secondary | ICD-10-CM | POA: Diagnosis not present

## 2014-10-13 DIAGNOSIS — C50912 Malignant neoplasm of unspecified site of left female breast: Secondary | ICD-10-CM

## 2014-10-13 NOTE — Patient Instructions (Signed)
Walk 30 minutes daily  Return in one year for general physical examination........ Tommi Rumps nafzinger is our new adult Designer, jewellery from [employer]

## 2014-10-13 NOTE — Progress Notes (Signed)
   Subjective:    Patient ID: Jessica Ashley, female    DOB: 11/15/1934, 80 y.o.   MRN: [id number]  HPI Jessica Ashley is a 80 year old married female nonsmoker who comes in today for general physical examination because of a history of breast cancer  She had a malignancy removed from her left breast and is getting follow-up radiation and tamoxifen which was discontinued in January 2017 after 5 years.  She uses eyedrops for glaucoma and sees her ophthalmologist twice yearly  She gets routine eye care, dental care, and you mammography, colonoscopy 2008,  Vaccinations up-to-date  Cognitive function normal she walks sparingly, home health safety reviewed no issues identified, no guns in the house, she states that she does have a healthcare power of attorney and living well    Review of Systems  Constitutional: Negative.   HENT: Negative.   Eyes: Negative.   Respiratory: Negative.   Cardiovascular: Negative.   Gastrointestinal: Negative.   Endocrine: Negative.   Genitourinary: Negative.   Musculoskeletal: Negative.   Skin: Negative.   Allergic/Immunologic: Negative.   Neurological: Negative.   Hematological: Negative.   Psychiatric/Behavioral: Negative.        Objective:   Physical Exam  Constitutional: She appears well-developed and well-nourished.  HENT:  Head: Normocephalic and atraumatic.  Right Ear: External ear normal.  Left Ear: External ear normal.  Nose: Nose normal.  Mouth/Throat: Oropharynx is clear and moist.  Eyes: EOM are normal. Pupils are equal, round, and reactive to light.  Neck: Normal range of motion. Neck supple. No JVD present. No tracheal deviation present. No thyromegaly present.  Cardiovascular: Normal rate, regular rhythm, normal heart sounds and intact distal pulses.  Exam reveals no gallop and no friction rub.   No murmur heard. Pulmonary/Chest: Effort normal and breath sounds normal. No stridor. No respiratory distress. She has no wheezes. She has no  rales. She exhibits no tenderness.  Abdominal: Soft. Bowel sounds are normal. She exhibits no distension and no mass. There is no tenderness. There is no rebound and no guarding.  Genitourinary: Uterus normal.  Right breast normal left breast scar at 9:00 3 inches with secondary skin changes from radiation therapy. No palpable masses  Musculoskeletal: Normal range of motion.  Lymphadenopathy:    She has no cervical adenopathy.  Neurological: She is alert. She has normal reflexes. No cranial nerve deficit. She exhibits normal muscle tone. Coordination normal.  Skin: Skin is warm and dry. No rash noted. No erythema. No pallor.  Psychiatric: She has a normal mood and affect. Her behavior is normal. Judgment and thought content normal.          Assessment & Plan:  Healthy female  5 years status post left breast cancer  History of glaucoma..... Continue eyedrops follow-up ophthalmologist

## 2014-10-13 NOTE — Progress Notes (Signed)
Pre visit review using our clinic review tool, if applicable. No additional management support is needed unless otherwise documented below in the visit note. 

## 2014-10-15 DIAGNOSIS — H6123 Impacted cerumen, bilateral: Secondary | ICD-10-CM | POA: Diagnosis not present

## 2014-10-15 DIAGNOSIS — H903 Sensorineural hearing loss, bilateral: Secondary | ICD-10-CM | POA: Diagnosis not present

## 2014-10-15 DIAGNOSIS — H60333 Swimmer's ear, bilateral: Secondary | ICD-10-CM | POA: Diagnosis not present

## 2014-10-20 ENCOUNTER — Encounter: Payer: Self-pay | Admitting: Family Medicine

## 2014-10-20 ENCOUNTER — Telehealth: Payer: Self-pay | Admitting: Family Medicine

## 2014-10-20 ENCOUNTER — Ambulatory Visit (INDEPENDENT_AMBULATORY_CARE_PROVIDER_SITE_OTHER): Payer: Medicare Other | Admitting: Family Medicine

## 2014-10-20 ENCOUNTER — Other Ambulatory Visit: Payer: Self-pay | Admitting: *Deleted

## 2014-10-20 VITALS — BP 118/74 | HR 75 | Temp 99.0°F | Ht 59.0 in | Wt 134.0 lb

## 2014-10-20 DIAGNOSIS — B029 Zoster without complications: Secondary | ICD-10-CM | POA: Insufficient documentation

## 2014-10-20 MED ORDER — ACYCLOVIR 400 MG PO TABS: ORAL_TABLET | ORAL | Status: DC

## 2014-10-20 MED ORDER — HYDROCODONE-ACETAMINOPHEN 7.5-300 MG PO TABS: ORAL_TABLET | ORAL | Status: DC

## 2014-10-20 NOTE — Telephone Encounter (Signed)
[facility] - Client [facility]  Patient Name: Jessica Ashley  Gender: Female  DOB: [birth year]   Age: 80 Y 3 M 24 D  Return Phone Number: [phone] (Primary)  Address:   City/State/Zip: [place] [address]   Client [facility] - Client  Client [facility] - Day  Physician Todd, [place] Type Call  Call Type Triage / Clinical  Relationship To Patient Self  Appointment Disposition EMR Appointment Attempted - Not Scheduled  Info pasted into Epic Yes  Return Phone Number [phone] (Primary)  Chief Complaint Headache  Initial Comment Caller States she has bad pains in her head, feels like nerve pains that has now moved to her ear. feels warm. started on friday and has progressively worse.   Nurse Assessment      Guidelines      Guideline Title Affirmed Question Affirmed Notes Nurse Date/Time (Eastern Time)         Disp. Time Eilene Ghazi Time) Disposition Final User   10/20/2014 9:14:34 AM Attempt made - no message left  Buck Mam, RN, Trish   10/20/2014 10:02:26 AM Attempt made - line busy  Buck Mam, RN, [place]    10/20/2014 10:31:53 AM FINAL ATTEMPT MADE - no message left Yes Buck Mam, RN, Trish       After Care Instructions Given     Call Event Type User Date / Time Description

## 2014-10-20 NOTE — Progress Notes (Signed)
Pre visit review using our clinic review tool, if applicable. No additional management support is needed unless otherwise documented below in the visit note. 

## 2014-10-20 NOTE — Progress Notes (Signed)
   Subjective:    Patient ID: Jessica Ashley, female    DOB: Sep 28, 1934, 80 y.o.   MRN: [id number]  HPI Jessica Ashley is a 80 year old married female nonsmoker who comes in today compared by her husband and her daughter for evaluation of a headache  She states that Saturday morning at 2 AM she awoke with a severe headache. She points to the top of her head as a source for pain. She said it radiates down the back of her neck. No history of trauma. Since that time she's had headaches that come and go. She says they last 5 seconds but occur about 10 times a day. She describes the pain is a 6 however she told her daughter was a 45. She's otherwise well no history of trauma   Review of Systems    review of systems otherwise negative specifically no fever chills nausea vomiting diarrhea. No change in vision hearing gait speech etc. Objective:   Physical Exam Well-developed well-nourished female no acute distress vital signs stable she's afebrile HEENT were negative except for vesicular rash in her hairline.       Assessment & Plan:  Acute shingles......Marland Kitchen begin acyclovir and pain medication follow-up

## 2014-10-20 NOTE — Patient Instructions (Signed)
Acyclovir 400 mg.......... take 2 tabs 3 times daily  Vicodin ES.........Marland Kitchen 1/2-1 tablet every 4-6 hours as needed for severe pain  Rest at home  Return next Tuesday for follow-up

## 2014-10-20 NOTE — Telephone Encounter (Signed)
Spoke with patient and Friday night she could not sleep.  She had neck pain which went to her head.  Now the pain is near her right ear.  The pain increases at night and it is a "throbbing pain".  She has tried Advil with some relief.  No complains of nausea, fever, or visual changes.  She did see a "bite mark" on the right side of her hairline. Please advise.

## 2014-10-20 NOTE — Telephone Encounter (Signed)
Appointment made

## 2014-10-27 ENCOUNTER — Encounter: Payer: Self-pay | Admitting: Family Medicine

## 2014-10-27 ENCOUNTER — Ambulatory Visit (INDEPENDENT_AMBULATORY_CARE_PROVIDER_SITE_OTHER): Payer: Medicare Other | Admitting: Family Medicine

## 2014-10-27 VITALS — BP 120/80 | Temp 98.7°F | Wt 132.0 lb

## 2014-10-27 DIAGNOSIS — B029 Zoster without complications: Secondary | ICD-10-CM | POA: Diagnosis not present

## 2014-10-27 NOTE — Patient Instructions (Signed)
Acyclovir 400 mg.......... to twice daily for 1 week....... then one twice daily for 1 week then stop

## 2014-10-27 NOTE — Progress Notes (Signed)
   Subjective:    Patient ID: Jessica Ashley, female    DOB: [birth year], 80 y.o.   MRN: [id number]  HPI Jessica Ashley is a 80 year old married female nonsmoker who comes in today for evaluation of shingles  We saw her last week with shingles. He presented with pain for couple days then of outbreak of the rash on the bright posterior scalp. We started on acyclovir 800 mg 3 times daily her pain is now down to a 2 or 3 it was a 10 the rash did not progress   Review of Systems Review of systems otherwise negative    Objective:   Physical Exam  Well-developed well-nourished female no acute distress vital signs stable she's afebrile examination right shows a couple vesicles but they're mostly healed      Assessment & Plan:  Shingles resolving.........Marland Kitchen

## 2014-10-27 NOTE — Progress Notes (Signed)
Pre visit review using our clinic review tool, if applicable. No additional management support is needed unless otherwise documented below in the visit note. 

## 2014-11-23 ENCOUNTER — Other Ambulatory Visit: Payer: Self-pay | Admitting: Hematology and Oncology

## 2014-11-23 DIAGNOSIS — R921 Mammographic calcification found on diagnostic imaging of breast: Secondary | ICD-10-CM

## 2014-12-02 ENCOUNTER — Telehealth: Payer: Self-pay | Admitting: Family Medicine

## 2014-12-02 NOTE — Telephone Encounter (Signed)
FYI

## 2014-12-02 NOTE — Telephone Encounter (Signed)
[facility] - Client [facility] Patient Name: Jessica Ashley DOB: 12/17/1934 Initial Comment Caller states c/o shingles outbreak on face Nurse Assessment Nurse: Erlene Quan, RN, Manuela Schwartz Date/Time Eilene Ghazi Time): 12/02/2014 2:18:35 PM Confirm and document reason for call. If symptomatic, describe symptoms. ---Caller states c/o shingles outbreak on face - she was dx on and has seen Dr Sherren Mocha twice last time was on May 31st - states the pain is getting better but the rash seems to be getting redder to her -0 she has never had any blisters but she is fearful of it involving her eye - it is not in her eye or close to it right now but Dr Sherren Mocha told her to call if was concerned so it can been checked again - she has finished her anti viral as he directed Has the patient traveled out of the country within the last 30 days? ---Not Applicable Does the patient require triage? ---Yes Related visit to physician within the last 2 weeks? ---No Does the PT have any chronic conditions? (i.e. diabetes, asthma, etc.) ---No Guidelines Guideline Title Affirmed Question Affirmed Notes Shingles [1] Shingle rash already diagnosed AND [2] weak immune system (e.g., HIV positive, cancer chemotherapy, chronic steroid treatment, splenectomy) AND [3] taking antiviral medication Final Disposition User See Physician within 24 Hours Erlene Quan, RN, Manuela Schwartz Comments appointment scheduled July 7th @ 11:15 am

## 2014-12-03 ENCOUNTER — Ambulatory Visit: Payer: Self-pay | Admitting: Family Medicine

## 2014-12-03 ENCOUNTER — Encounter: Payer: Self-pay | Admitting: Family Medicine

## 2014-12-03 ENCOUNTER — Ambulatory Visit (INDEPENDENT_AMBULATORY_CARE_PROVIDER_SITE_OTHER): Payer: Medicare Other | Admitting: Family Medicine

## 2014-12-03 VITALS — BP 120/73 | HR 74 | Temp 98.7°F | Ht 59.0 in | Wt 135.0 lb

## 2014-12-03 DIAGNOSIS — B029 Zoster without complications: Secondary | ICD-10-CM | POA: Diagnosis not present

## 2014-12-03 DIAGNOSIS — L509 Urticaria, unspecified: Secondary | ICD-10-CM

## 2014-12-03 MED ORDER — LORAZEPAM 0.5 MG PO TABS: 0.5000 mg | ORAL_TABLET | Freq: Two times a day (BID) | ORAL | Status: DC | PRN

## 2014-12-03 MED ORDER — METHYLPREDNISOLONE 4 MG PO TBPK: ORAL_TABLET | ORAL | Status: DC

## 2014-12-03 NOTE — Progress Notes (Signed)
Pre visit review using our clinic review tool, if applicable. No additional management support is needed unless otherwise documented below in the visit note. 

## 2014-12-03 NOTE — Progress Notes (Signed)
   Subjective:    Patient ID: Jessica Ashley, female    DOB: 1935/04/30, 80 y.o.   MRN: [id number]  HPI Here to follow up shingles and itching around the face. She was seen on 10-20-14 with a painful rash on the back of the neck and around the right side of the scalp. She was given Acyclovir and she took this a total of about 3 weeks. The rash resolved and the pain resolved, but over the past week she has had itching around the forehead and the face. No eye pain and no change in vision. She also mentions that she struggles with anxiety. She worries about things and has trouble sleeping at times. She thinks her anxiety has made the itching worse.    Review of Systems  Constitutional: Negative.   Respiratory: Negative.   Cardiovascular: Negative.   Skin: Positive for rash.  Neurological: Negative.   Psychiatric/Behavioral: Negative for hallucinations, behavioral problems, confusion, sleep disturbance, dysphoric mood, decreased concentration and agitation. The patient is nervous/anxious.        Objective:   Physical Exam  Constitutional: She is oriented to person, place, and time. She appears well-developed and well-nourished. No distress.  HENT:  Head: Normocephalic and atraumatic.  Eyes: Conjunctivae and EOM are normal. Pupils are equal, round, and reactive to light.  Neurological: She is alert and oriented to person, place, and time.  Skin: No rash noted. No erythema.  Skin is clear around the scalp and face  Psychiatric: She has a normal mood and affect. Her behavior is normal. Thought content normal.          Assessment & Plan:  She may have a mild hives-like reaction around the face. Given a Medrol dose pack. Try Ativan prn anxiety.

## 2014-12-29 ENCOUNTER — Ambulatory Visit
Admission: RE | Admit: 2014-12-29 | Discharge: 2014-12-29 | Disposition: A | Discharge status: Home / Self Care | Payer: Medicare Other | Source: Ambulatory Visit | Attending: Hematology and Oncology | Admitting: Hematology and Oncology

## 2014-12-29 DIAGNOSIS — R921 Mammographic calcification found on diagnostic imaging of breast: Secondary | ICD-10-CM

## 2015-02-05 DIAGNOSIS — H40013 Open angle with borderline findings, low risk, bilateral: Secondary | ICD-10-CM | POA: Diagnosis not present

## 2015-02-05 DIAGNOSIS — H2513 Age-related nuclear cataract, bilateral: Secondary | ICD-10-CM | POA: Diagnosis not present

## 2015-02-05 DIAGNOSIS — H40053 Ocular hypertension, bilateral: Secondary | ICD-10-CM | POA: Diagnosis not present

## 2015-02-05 DIAGNOSIS — H1851 Endothelial corneal dystrophy: Secondary | ICD-10-CM | POA: Diagnosis not present

## 2015-03-02 ENCOUNTER — Ambulatory Visit: Payer: Medicare Other | Admitting: Adult Health

## 2015-03-02 ENCOUNTER — Ambulatory Visit (INDEPENDENT_AMBULATORY_CARE_PROVIDER_SITE_OTHER): Payer: Medicare Other | Admitting: Family Medicine

## 2015-03-02 ENCOUNTER — Encounter: Payer: Self-pay | Admitting: Family Medicine

## 2015-03-02 VITALS — BP 116/74 | HR 72 | Temp 99.0°F | Ht 59.0 in | Wt 136.0 lb

## 2015-03-02 DIAGNOSIS — B029 Zoster without complications: Secondary | ICD-10-CM

## 2015-03-02 DIAGNOSIS — Z23 Encounter for immunization: Secondary | ICD-10-CM

## 2015-03-02 NOTE — Addendum Note (Signed)
Addended by: Aggie Hacker A on: 03/02/2015 03:18 PM   Modules accepted: Orders

## 2015-03-02 NOTE — Progress Notes (Signed)
   Subjective:    Patient ID: Jessica Ashley, female    DOB: [birth year], 80 y.o.   MRN: [id number]  HPI Here asking if it would be okay to receive a flu shot. She has had some recurrent bouts of shingles around the scalp, face, and neck this summer. The last such bout was several weeks ago. She feels well now.    Review of Systems  Constitutional: Negative.   Respiratory: Negative.   Cardiovascular: Negative.   Skin: Positive for rash.  Neurological: Negative.        Objective:   Physical Exam  Constitutional: She is oriented to person, place, and time. She appears well-developed and well-nourished.  HENT:  Right Ear: External ear normal.  Left Ear: External ear normal.  Nose: Nose normal.  Mouth/Throat: Oropharynx is clear and moist.  Eyes: Conjunctivae are normal.  Neck: Neck supple. No thyromegaly present.  Lymphadenopathy:    She has no cervical adenopathy.  Neurological: She is alert and oriented to person, place, and time. No cranial nerve deficit.  Skin: No rash noted. No erythema.          Assessment & Plan:  Today she looks great. The shingles are in remission. Given a flu shot

## 2015-03-02 NOTE — Progress Notes (Signed)
Pre visit review using our clinic review tool, if applicable. No additional management support is needed unless otherwise documented below in the visit note. 

## 2015-06-23 ENCOUNTER — Other Ambulatory Visit: Payer: Self-pay | Admitting: Hematology and Oncology

## 2015-06-23 DIAGNOSIS — R921 Mammographic calcification found on diagnostic imaging of breast: Secondary | ICD-10-CM

## 2015-06-30 HISTORY — PX: BREAST BIOPSY: SHX20

## 2015-07-02 ENCOUNTER — Other Ambulatory Visit: Payer: Self-pay | Admitting: Hematology and Oncology

## 2015-07-02 ENCOUNTER — Ambulatory Visit
Admission: RE | Admit: 2015-07-02 | Discharge: 2015-07-02 | Disposition: A | Discharge status: Home / Self Care | Payer: Medicare Other | Source: Ambulatory Visit | Attending: Hematology and Oncology | Admitting: Hematology and Oncology

## 2015-07-02 DIAGNOSIS — R921 Mammographic calcification found on diagnostic imaging of breast: Secondary | ICD-10-CM | POA: Diagnosis not present

## 2015-07-07 ENCOUNTER — Ambulatory Visit
Admission: RE | Admit: 2015-07-07 | Discharge: 2015-07-07 | Disposition: A | Discharge status: Home / Self Care | Payer: Medicare Other | Source: Ambulatory Visit | Attending: Hematology and Oncology | Admitting: Hematology and Oncology

## 2015-07-07 DIAGNOSIS — R921 Mammographic calcification found on diagnostic imaging of breast: Secondary | ICD-10-CM | POA: Diagnosis not present

## 2015-07-07 DIAGNOSIS — N6032 Fibrosclerosis of left breast: Secondary | ICD-10-CM | POA: Diagnosis not present

## 2015-10-12 ENCOUNTER — Encounter: Payer: Self-pay | Admitting: Adult Health

## 2015-10-12 ENCOUNTER — Ambulatory Visit (INDEPENDENT_AMBULATORY_CARE_PROVIDER_SITE_OTHER): Payer: Medicare Other | Admitting: Adult Health

## 2015-10-12 VITALS — BP 118/72 | Temp 97.9°F | Ht 59.0 in | Wt 134.7 lb

## 2015-10-12 DIAGNOSIS — Z7189 Other specified counseling: Secondary | ICD-10-CM | POA: Diagnosis not present

## 2015-10-12 DIAGNOSIS — M25511 Pain in right shoulder: Secondary | ICD-10-CM

## 2015-10-12 DIAGNOSIS — Z7689 Persons encountering health services in other specified circumstances: Secondary | ICD-10-CM

## 2015-10-12 NOTE — Progress Notes (Signed)
Patient presents to clinic today to establish care. She is a pleasant caucasian female who  has a past medical history of Breast cancer ([place]) (2011); Cataract; Thyroid disease; Fuchs' corneal dystrophy; Shingles; and Osteopenia.  Her last physical was in 10/13/2014 with MD Sherren Mocha  Acute Concerns: Establish Care  Chronic Issues:  Breast Cancer  - Has completed Tamoxifen in 2016. Has not had any complications since.   Right Shoulder Pain - This has been an ongoing issue. She has been getting acupuncture and feels as though it is finally at a "comfortable level". Report that she has trouble getting her arms above her head. She also reports that she sometimes has pain that radiates from her shoulder to right hand.   Health Maintenance: Dental --Twice a year Vision --Every six months Immunizations -- UTD Colonoscopy --2008 Mammogram -- 06/2015. Pathology showed fibrosis, calcification and chronic inflammation PAP -- No abnormal. No longer needs Bone Density -- 2012 Diet: Eats out a lot but eats chicken and vegetables.  Exercise: Walks every once in awhile.    Past Medical History  Diagnosis Date  . Cancer ([place])   . Cataract   . Glaucoma   . Thyroid disease     thyroid surgery    Past Surgical History  Procedure Laterality Date  . Cholecystectomy  2011  . Breast lumpectomy  2011  . Tonsillectomy  1953  . Dilation and curettage, diagnostic / therapeutic  1993    Current Outpatient Prescriptions on File Prior to Visit  Medication Sig Dispense Refill  . aspirin 81 MG tablet Take 81 mg by mouth daily.      . Calcium Carbonate-Vitamin D (CALCIUM-VITAMIN D3 PO) Take 1 tablet by mouth daily.     . Cholecalciferol (VITAMIN D) 2000 UNITS CAPS Take by mouth.      . Hydrocodone-Acetaminophen (VICODIN ES) 7.5-300 MG TABS 1/2-1 tablet every 4-6 hours as needed for severe pain 40 each 0  . timolol (TIMOPTIC) 0.5 % ophthalmic solution Place 1 drop into both eyes daily.      No  current facility-administered medications on file prior to visit.    Allergies  Allergen Reactions  . Chlorhexidine Gluconate Swelling and Rash  . Sulfur Swelling    Face and possibly entire body if allowed without intervention.  Marland Kitchen Dextroamphetamine Sulfate     Per patient she is unaware of reaction to this medication.  . Sulfa Antibiotics Swelling  . Penicillins Rash    Where injected    Family History  Problem Relation Age of Onset  . Cancer Sister 33    bladder  . Pneumonia Mother   . Heart disease Mother     Social History   Social History  . Marital Status: Married    Spouse Name: N/A  . Number of Children: N/A  . Years of Education: N/A   Occupational History  . Not on file.   Social History Main Topics  . Smoking status: Never Smoker   . Smokeless tobacco: Never Used  . Alcohol Use: No  . Drug Use: No  . Sexual Activity:    Partners: Male     Comment: married   Other Topics Concern  . Not on file   Social History Narrative    Review of Systems  Constitutional: Negative.   Respiratory: Negative.   Cardiovascular: Negative.   Genitourinary: Negative.   Musculoskeletal: Positive for joint pain (right shoulder).  Neurological: Negative.   Psychiatric/Behavioral: Negative.   All other systems reviewed  and are negative.   Temp(Src) 97.9 F (36.6 C) (Oral)  Ht 4\' 11"  (1.499 m)  Wt 134 lb 11.2 oz (61.1 kg)  BMI 27.19 kg/m2  Physical Exam  Constitutional: She is oriented to person, place, and time and well-developed, well-nourished, and in no distress. No distress.  HENT:  Head: Normocephalic and atraumatic.  Right Ear: External ear normal.  Left Ear: External ear normal.  Nose: Nose normal.  Mouth/Throat: Oropharynx is clear and moist. No oropharyngeal exudate.  Eyes: Conjunctivae are normal. Pupils are equal, round, and reactive to light.  Neck: Normal range of motion. Neck supple.  Cardiovascular: Normal rate, regular rhythm, normal heart  sounds and intact distal pulses.  Exam reveals no gallop and no friction rub.   No murmur heard. Pulmonary/Chest: Effort normal and breath sounds normal. No respiratory distress. She has no wheezes. She has no rales. She exhibits no tenderness.  Abdominal: She exhibits shifting dullness.  Musculoskeletal: Normal range of motion. She exhibits no edema or tenderness.  Is unable to raise right arm above half way point without discomfort  Lymphadenopathy:    She has no cervical adenopathy.  Neurological: She is alert and oriented to person, place, and time. Gait normal. GCS score is 15.  Skin: Skin is warm and dry. No rash noted. She is not diaphoretic. No erythema.  Psychiatric: Mood, memory, affect and judgment normal.  Vitals reviewed.   Assessment/Plan:  1. Encounter to establish care - Follow up for CPE - Work on diet and increasing walking   2. Right shoulder pain - need to consider Rotator cuff surgery. She has had a shoulder injection in the past as well. She would like to continue to have acupunture.  - Will consider sports medicine for [place] of shoulder  - Consider another shoulder injection.   Dorothyann Peng, NP

## 2015-10-12 NOTE — Patient Instructions (Addendum)
It was great seeing you today!  Please follow up with me this summer for your physical.   If you need anything in the meantime, please let me know.   Health Maintenance, Female Adopting a healthy lifestyle and getting preventive care can go a long way to promote health and wellness. Talk with your health care provider about what schedule of regular examinations is right for you. This is a good chance for you to check in with your provider about disease prevention and staying healthy. In between checkups, there are plenty of things you can do on your own. Experts have done a lot of research about which lifestyle changes and preventive measures are most likely to keep you healthy. Ask your health care provider for more information. WEIGHT AND DIET  Eat a healthy diet  Be sure to include plenty of vegetables, fruits, low-fat dairy products, and lean protein.  Do not eat a lot of foods high in solid fats, added sugars, or salt.  Get regular exercise. This is one of the most important things you can do for your health.  Most adults should exercise for at least 150 minutes each week. The exercise should increase your heart rate and make you sweat (moderate-intensity exercise).  Most adults should also do strengthening exercises at least twice a week. This is in addition to the moderate-intensity exercise.  Maintain a healthy weight  Body mass index (BMI) is a measurement that can be used to identify possible weight problems. It estimates body fat based on height and weight. Your health care provider can help determine your BMI and help you achieve or maintain a healthy weight.  For females 35 years of age and older:   A BMI below 18.5 is considered underweight.  A BMI of 18.5 to 24.9 is normal.  A BMI of 25 to 29.9 is considered overweight.  A BMI of 30 and above is considered obese.  Watch levels of cholesterol and blood lipids  You should start having your blood tested for lipids  and cholesterol at 79 years of age, then have this test every 5 years.  You may need to have your cholesterol levels checked more often if:  Your lipid or cholesterol levels are high.  You are older than 79 years of age.  You are at high risk for heart disease.  CANCER SCREENING   Lung Cancer  Lung cancer screening is recommended for adults 24-64 years old who are at high risk for lung cancer because of a history of smoking.  A yearly low-dose CT scan of the lungs is recommended for people who:  Currently smoke.  Have quit within the past 15 years.  Have at least a 30-pack-year history of smoking. A pack year is smoking an average of one pack of cigarettes a day for 1 year.  Yearly screening should continue until it has been 15 years since you quit.  Yearly screening should stop if you develop a health problem that would prevent you from having lung cancer treatment.  Breast Cancer  Practice breast self-awareness. This means understanding how your breasts normally appear and feel.  It also means doing regular breast self-exams. Let your health care provider know about any changes, no matter how small.  If you are in your 20s or 30s, you should have a clinical breast exam (CBE) by a health care provider every 1-3 years as part of a regular health exam.  If you are 69 or older, have a CBE every  year. Also consider having a breast X-ray (mammogram) every year.  If you have a family history of breast cancer, talk to your health care provider about genetic screening.  If you are at high risk for breast cancer, talk to your health care provider about having an MRI and a mammogram every year.  Breast cancer gene (BRCA) assessment is recommended for women who have family members with BRCA-related cancers. BRCA-related cancers include:  Breast.  Ovarian.  Tubal.  Peritoneal cancers.  Results of the assessment will determine the need for genetic counseling and BRCA1 and  BRCA2 testing. Cervical Cancer Your health care provider may recommend that you be screened regularly for cancer of the pelvic organs (ovaries, uterus, and vagina). This screening involves a pelvic examination, including checking for microscopic changes to the surface of your cervix (Pap test). You may be encouraged to have this screening done every 3 years, beginning at age 21.  For women ages 30-65, health care providers may recommend pelvic exams and Pap testing every 3 years, or they may recommend the Pap and pelvic exam, combined with testing for human papilloma virus (HPV), every 5 years. Some types of HPV increase your risk of cervical cancer. Testing for HPV may also be done on women of any age with unclear Pap test results.  Other health care providers may not recommend any screening for nonpregnant women who are considered low risk for pelvic cancer and who do not have symptoms. Ask your health care provider if a screening pelvic exam is right for you.  If you have had past treatment for cervical cancer or a condition that could lead to cancer, you need Pap tests and screening for cancer for at least 20 years after your treatment. If Pap tests have been discontinued, your risk factors (such as having a new sexual partner) need to be reassessed to determine if screening should resume. Some women have medical problems that increase the chance of getting cervical cancer. In these cases, your health care provider may recommend more frequent screening and Pap tests. Colorectal Cancer  This type of cancer can be detected and often prevented.  Routine colorectal cancer screening usually begins at 79 years of age and continues through 79 years of age.  Your health care provider may recommend screening at an earlier age if you have risk factors for colon cancer.  Your health care provider may also recommend using home test kits to check for hidden blood in the stool.  A small camera at the end of  a tube can be used to examine your colon directly (sigmoidoscopy or colonoscopy). This is done to check for the earliest forms of colorectal cancer.  Routine screening usually begins at age 50.  Direct examination of the colon should be repeated every 5-10 years through 79 years of age. However, you may need to be screened more often if early forms of precancerous polyps or small growths are found. Skin Cancer  Check your skin from head to toe regularly.  Tell your health care provider about any new moles or changes in moles, especially if there is a change in a mole's shape or color.  Also tell your health care provider if you have a mole that is larger than the size of a pencil eraser.  Always use sunscreen. Apply sunscreen liberally and repeatedly throughout the day.  Protect yourself by wearing long sleeves, pants, a wide-brimmed hat, and sunglasses whenever you are outside. HEART DISEASE, DIABETES, AND HIGH BLOOD PRESSURE     High blood pressure causes heart disease and increases the risk of stroke. High blood pressure is more likely to develop in:  People who have blood pressure in the high end of the normal range (130-139/85-89 mm Hg).  People who are overweight or obese.  People who are African American.  If you are 18-39 years of age, have your blood pressure checked every 3-5 years. If you are 40 years of age or older, have your blood pressure checked every year. You should have your blood pressure measured twice--once when you are at a hospital or clinic, and once when you are not at a hospital or clinic. Record the average of the two measurements. To check your blood pressure when you are not at a hospital or clinic, you can use:  An automated blood pressure machine at a pharmacy.  A home blood pressure monitor.  If you are between 55 years and 79 years old, ask your health care provider if you should take aspirin to prevent strokes.  Have regular diabetes screenings. This  involves taking a blood sample to check your fasting blood sugar level.  If you are at a normal weight and have a low risk for diabetes, have this test once every three years after 79 years of age.  If you are overweight and have a high risk for diabetes, consider being tested at a younger age or more often. PREVENTING INFECTION  Hepatitis B  If you have a higher risk for hepatitis B, you should be screened for this virus. You are considered at high risk for hepatitis B if:  You were born in a country where hepatitis B is common. Ask your health care provider which countries are considered high risk.  Your parents were born in a high-risk country, and you have not been immunized against hepatitis B (hepatitis B vaccine).  You have HIV or AIDS.  You use needles to inject street drugs.  You live with someone who has hepatitis B.  You have had sex with someone who has hepatitis B.  You get hemodialysis treatment.  You take certain medicines for conditions, including cancer, organ transplantation, and autoimmune conditions. Hepatitis C  Blood testing is recommended for:  Everyone born from 1945 through 1965.  Anyone with known risk factors for hepatitis C. Sexually transmitted infections (STIs)  You should be screened for sexually transmitted infections (STIs) including gonorrhea and chlamydia if:  You are sexually active and are younger than 79 years of age.  You are older than 79 years of age and your health care provider tells you that you are at risk for this type of infection.  Your sexual activity has changed since you were last screened and you are at an increased risk for chlamydia or gonorrhea. Ask your health care provider if you are at risk.  If you do not have HIV, but are at risk, it may be recommended that you take a prescription medicine daily to prevent HIV infection. This is called pre-exposure prophylaxis (PrEP). You are considered at risk if:  You are  sexually active and do not regularly use condoms or know the HIV status of your partner(s).  You take drugs by injection.  You are sexually active with a partner who has HIV. Talk with your health care provider about whether you are at high risk of being infected with HIV. If you choose to begin PrEP, you should first be tested for HIV. You should then be tested every 3 months for as   long as you are taking PrEP.  PREGNANCY   If you are premenopausal and you may become pregnant, ask your health care provider about preconception counseling.  If you may become pregnant, take 400 to 800 micrograms (mcg) of folic acid every day.  If you want to prevent pregnancy, talk to your health care provider about birth control (contraception). OSTEOPOROSIS AND MENOPAUSE   Osteoporosis is a disease in which the bones lose minerals and strength with aging. This can result in serious bone fractures. Your risk for osteoporosis can be identified using a bone density scan.  If you are 27 years of age or older, or if you are at risk for osteoporosis and fractures, ask your health care provider if you should be screened.  Ask your health care provider whether you should take a calcium or vitamin D supplement to lower your risk for osteoporosis.  Menopause may have certain physical symptoms and risks.  Hormone replacement therapy may reduce some of these symptoms and risks. Talk to your health care provider about whether hormone replacement therapy is right for you.  HOME CARE INSTRUCTIONS   Schedule regular health, dental, and eye exams.  Stay current with your immunizations.   Do not use any tobacco products including cigarettes, chewing tobacco, or electronic cigarettes.  If you are pregnant, do not drink alcohol.  If you are breastfeeding, limit how much and how often you drink alcohol.  Limit alcohol intake to no more than 1 drink per day for nonpregnant women. One drink equals 12 ounces of beer, 5  ounces of wine, or 1 ounces of hard liquor.  Do not use street drugs.  Do not share needles.  Ask your health care provider for help if you need support or information about quitting drugs.  Tell your health care provider if you often feel depressed.  Tell your health care provider if you have ever been abused or do not feel safe at home.   This information is not intended to replace advice given to you by your health care provider. Make sure you discuss any questions you have with your health care provider.   Document Released: 11/28/2010 Document Revised: 06/05/2014 Document Reviewed: 04/16/2013 Elsevier Interactive Patient Education [employer].

## 2015-10-23 ENCOUNTER — Telehealth: Payer: Self-pay | Admitting: Nurse Practitioner

## 2015-10-23 NOTE — Telephone Encounter (Signed)
Left message for patient re 11/01/15 LTS visit and mailed schedule.

## 2015-11-01 ENCOUNTER — Encounter: Payer: Self-pay | Admitting: Nurse Practitioner

## 2015-11-01 ENCOUNTER — Telehealth: Payer: Self-pay | Admitting: Nurse Practitioner

## 2015-11-01 ENCOUNTER — Ambulatory Visit (HOSPITAL_BASED_OUTPATIENT_CLINIC_OR_DEPARTMENT_OTHER): Payer: Medicare Other | Admitting: Nurse Practitioner

## 2015-11-01 VITALS — BP 129/64 | HR 70 | Temp 98.3°F | Resp 17 | Ht 59.0 in | Wt 135.2 lb

## 2015-11-01 DIAGNOSIS — M25511 Pain in right shoulder: Secondary | ICD-10-CM | POA: Diagnosis not present

## 2015-11-01 DIAGNOSIS — C50912 Malignant neoplasm of unspecified site of left female breast: Secondary | ICD-10-CM

## 2015-11-01 DIAGNOSIS — Z853 Personal history of malignant neoplasm of breast: Secondary | ICD-10-CM | POA: Diagnosis not present

## 2015-11-01 NOTE — Patient Instructions (Signed)
Thank you for coming in today!  As we discussed, please continue to perform your self breast exam and report any changes. If you note any new symptoms (please see below), be sure to notify us ASAP.  Your mammogram will be due in February 2018 and we will enter orders for it today.  We'll have you return in one year's time for your next appointment or sooner if you have any problems. Please be sure to stop by scheduling on your way out to make those appointment(s).  Looking forward to working with you in the future!  Let us know if you have any questions!  Symptoms to Watch for and Report to Your Provider  . Return of the cancer symptoms you had before- such as a lump or new growth where your cancer first started . New or unusual pain that seems unrelated to an injury and does not go away, including back pain or bone pain . Weight loss without trying/intending . Unexplained bleeding . A rash or allergic reaction, such as swelling, severe itching or wheezing . Chills or fevers . Persistent headaches . Shortness of breath or difficulty breathing . Bloody stools or blood in your urine . Lumps, bumps, swelling and/or nipple discharge . Nausea, vomiting, diarrhea, loss of appetite, or trouble swallowing . A cough that doesn't go away . Abdominal pain . Swelling in your arms or legs . Fractures . Hot flashes or other menopausal symptoms . Any other signs mentioned by your doctor or nurse or any unusual symptoms                 that you just can't explain   NOTE: Just because you have certain symptoms, it doesn't mean the cancer has come back or you have a new cancer. Symptoms can be due to other problems that need to be addressed.  It is important to watch for these symptoms and report them to your provider so you can be medically evaluated for any of these concerns!     Living a Life of Wellness After Cancer:  *Note: Please consult your health care provider before using any medications,  supplements, over-the-counter products, or other interventions.  Also, please consult your primary care provider before you begin any lifestyle program (diet, exercise, etc.).  Your safety is our top priority and we want to make sure you continue to live a long and healthy life!    Healthy Lifestyle Recommendations  As a cancer survivor, it is important develop a lifelong commitment to a healthy lifestyle. A healthy lifestyle can prevent cancer from returning as well as prevent other diseases like heart disease, diabetes and high blood pressure.  These are some things that you can do to have a healthy lifestyle:  Marland Kitchen Maintain a healthy weight.  . Exercise daily per your doctor's orders. . Eat a balanced diet high in fruits, vegetables, bran, and fiber. Limit intake of red meat      and processed foods.  . Limit how much alcohol you consume, if at all. Ali Lowe regular bone mineral density testing for osteoporosis.  . Talk to your doctor about cardiovascular disease or "heart disease" screening. . Stop smoking (if you smoke). . Know your family history. . Be mindful of your emotional, social, and spiritual needs. . Meet regularly with a Primary Care Provider (PCP). Find a PCP if you do not             already have one. . Talk to your doctor  about regular cancer screening including screening for colon           cancer, GYN cancers, and skin cancer.

## 2015-11-01 NOTE — Telephone Encounter (Signed)
Gave pt avs °

## 2015-11-01 NOTE — Progress Notes (Signed)
CLINIC:  Cancer Survivorship   REASON FOR VISIT:  Routine follow-up post-treatment for history of breast cancer.  BRIEF ONCOLOGIC HISTORY:    Breast cancer, left breast ([place])   07/16/2009 Surgery Left breast lumpectomy: Invasive ductal carcinoma, ER 98%, PR 100%, Ki-67 8%, HER-2 negative ratio 1.4   07/16/2009 Pathologic Stage Stage IA: T1c N0   08/12/2009 - 09/21/2009 Radiation Therapy Adjuvant radiation therapy   09/26/2009 - 09/01/2014 Anti-estrogen oral therapy Multiple anti-estrogen therapies were tried and finally completed 5 years of tamoxifen    INTERVAL HISTORY:  Ms. Holness presents to the [facility] today for ongoing follow up regarding her history of breast cancer. Ms. Bickhart underwent biopsy of calcifications that have been being followed over the past year in her left breast.  These were found to be benign and she has recovered well from the biopsy.  She has not noticed any change otherise within her breast. She denies any headache, cough, shortness of breath, or bone pain other than right shoulder pain that has been a long standing problem.  There is some concern that this may be resulting from a rotator cuff injury, but she is reluctant to pursue surgery.  She reports a good appetite and denies any weight loss.    REVIEW OF SYSTEMS:  General: Denies fever, chills, unintentional weight loss, or generalized fatigue.  HEENT: Wears glasses. Some visual changes and hearing loss.  Denies mouth sores, or difficulty swallowing. Cardiac: Denies palpitations and lower extremity edema.  Respiratory: Denies wheeze or dyspnea on exertion.  Breast: As above. GI: Denies abdominal pain, constipation, diarrhea, nausea, or vomiting.  GU: Occasional dribbling of urine. Denies dysuria, hematuria, vaginal bleeding, vaginal discharge, or vaginal dryness.  Musculoskeletal: As above. Neuro: Denies recent fall or numbness / tingling in her extremities.  Skin: Denies rash, pruritis, or open  wounds.  Psych: Denies depression, anxiety, insomnia, or memory loss.   A 14-point review of systems was completed and was negative, except as noted above.   ONCOLOGY TREATMENT TEAM:  1. Surgeon:  Dr. Dalbert Batman at [facility]  2. Medical Oncologist: Dr. Truddie Coco / Lindi Adie 3. Radiation Oncologist: Dr. Pablo Ledger    PAST MEDICAL/SURGICAL HISTORY:  Past Medical History  Diagnosis Date  . Breast cancer ([place]) 2011  . Cataract   . Thyroid disease     thyroid surgery  . Fuchs' corneal dystrophy   . Shingles   . Osteopenia    Past Surgical History  Procedure Laterality Date  . Cholecystectomy  2011  . Breast lumpectomy  2011  . Tonsillectomy  1953  . Dilation and curettage, diagnostic / therapeutic  1993    bladder ?     ALLERGIES:  Allergies  Allergen Reactions  . Chlorhexidine Gluconate Swelling and Rash  . Sulfur Swelling    Face and possibly entire body if allowed without intervention.  Marland Kitchen Dextroamphetamine Sulfate     Per patient she is unaware of reaction to this medication.  . Sulfa Antibiotics Swelling  . Penicillins Rash    Where injected     CURRENT MEDICATIONS:  Current Outpatient Prescriptions on File Prior to Visit  Medication Sig Dispense Refill  . aspirin 81 MG tablet Take 81 mg by mouth daily.      . Calcium Carbonate-Vitamin D (CALCIUM-VITAMIN D3 PO) Take 1 tablet by mouth daily.     . Cholecalciferol (VITAMIN D) 2000 UNITS CAPS Take by mouth.      . timolol (TIMOPTIC) 0.5 % ophthalmic solution Place 1  drop into both eyes daily.      No current facility-administered medications on file prior to visit.     ONCOLOGIC FAMILY HISTORY:  Family History  Problem Relation Age of Onset  . Cancer Sister 109    bladder  . Pneumonia Mother   . Heart disease Mother      GENETIC COUNSELING/TESTING: No   SOCIAL HISTORY:  CAROLE DONER is married and lives with her spouse in [place], [place].   Ms. Leib is currently retired.  She  denies any current or history of tobacco, alcohol, or illicit drug use.     PHYSICAL EXAMINATION:  Vital Signs: Filed Vitals:   11/01/15 1551  BP: 129/64  Pulse: 70  Temp: 98.3 F (36.8 C)  Resp: 17   Weight: 135.2 ECOG performance status:0 General: Well-nourished, well-appearing female in no acute distress.  She is unaccompanied in clinic today.   HEENT: Head is atraumatic and normocephalic.  Pupils equal and reactive to light and accomodation. Conjunctivae clear without exudate.  Sclerae anicteric. Oral mucosa is pink, moist, and intact without lesions.  Oropharynx is pink without lesions or erythema.  Lymph: No cervical, supraclavicular, infraclavicular, or axillary lymphadenopathy noted on palpation.  Cardiovascular: Regular rate and rhythm without murmurs, rubs, or gallops. Respiratory: Clear to auscultation bilaterally. Chest expansion symmetric without accessory muscle use on inspiration or expiration.  Breast: Bilateral breast exam performed.  Left lumpectomy scar intact without nodularity.  No mass or lesion in either breast. GI: Abdomen soft and round. No tenderness to palpation. Bowel sounds normoactive in 4 quadrants. No hepatosplenomegaly.   GU: Deferred.  Musculoskeletal: Muscle strength 5/5 in all extremities.   Neuro: No focal deficits. Steady gait.  Psych: Mood and affect normal and appropriate for situation.  Extremities: No edema, cyanosis, or clubbing.  Skin: Warm and dry. No open lesions noted.   LABORATORY DATA:  No results found for this or any previous visit (from the past 2160 hour(s)).     ASSESSMENT AND PLAN:   1. History of breast cancer: Stage IA invasive ductal carcinoma of the left breast (06/2009), ER positive, PR positive, HER2/neu negative, S/P lumpectomy (06/2009) followed by adjuvant radiation therapy completed 08/2009 followed by five years of adjuvant endocrine therapy completed 08/2014 with multiple anti-estrogen agents (changed periodically due  to side effects).  Ms. Winningham is doing well with no clinical symptoms worrisome for cancer recurrence at this time. I have reviewed the recommendations for ongoing surveillance with her and she will follow-up with [place] in one year's time iwith history and physical exam per surveillance protocol.  She was instructed to make [place] aware if she notes any change within her breast, any new symptoms such as pain, shortness of breath, weight loss, or fatigue.  She will be due mammogram in February 2018 and we have entered orders for this to be scheduled following today's appointment.  2. Cancer screening:  Due to Ms. Mac's history and her age, she should receive screening for skin cancers, colon cancer, and gynecologic cancers.  The information and recommendations were shared with the patient and in her written after visit summary.  3. Health maintenance and wellness promotion:Ms. Westervelt and I discussed recommendations to maximize nutrition and minimize recurrence, such as increased intake of fruits, vegetables, lean proteins, and minimizing the intake of red meats and processed foods.  She was also encouraged to engage in moderate to vigorous exercise for 30 minutes per day most days of the week. She was instructed to  limit her alcohol consumption and continue to abstain from tobacco use/.     4. Support services/counseling: Ms. Schiano was offered support today through active listening and expressive supportive counseling.  She was congratulated on being a survivor for 6 years now and wished many years of cancer free life.    A total of 25 minutes of face-to-face time was spent with this patient with greater than 50% of that time in counseling and care-coordination.   Sylvan Cheese, NP  Survivorship Program [facility] [phone]   Note: PRIMARY CARE PROVIDER Dorothyann Peng, [place] [phone]

## 2015-11-22 DIAGNOSIS — H52203 Unspecified astigmatism, bilateral: Secondary | ICD-10-CM | POA: Diagnosis not present

## 2015-11-22 DIAGNOSIS — H1851 Endothelial corneal dystrophy: Secondary | ICD-10-CM | POA: Diagnosis not present

## 2015-11-22 DIAGNOSIS — H2513 Age-related nuclear cataract, bilateral: Secondary | ICD-10-CM | POA: Diagnosis not present

## 2016-01-13 ENCOUNTER — Ambulatory Visit (INDEPENDENT_AMBULATORY_CARE_PROVIDER_SITE_OTHER): Payer: Medicare Other | Admitting: Adult Health

## 2016-01-13 ENCOUNTER — Encounter: Payer: Self-pay | Admitting: Adult Health

## 2016-01-13 VITALS — BP 102/62 | Temp 98.2°F | Ht 59.0 in | Wt 133.8 lb

## 2016-01-13 DIAGNOSIS — E079 Disorder of thyroid, unspecified: Secondary | ICD-10-CM | POA: Diagnosis not present

## 2016-01-13 DIAGNOSIS — C50912 Malignant neoplasm of unspecified site of left female breast: Secondary | ICD-10-CM

## 2016-01-13 LAB — BASIC METABOLIC PANEL
BUN: 13 mg/dL (ref 6–23)
CO2: 28 meq/L (ref 19–32)
CREATININE: 0.72 mg/dL (ref 0.40–1.20)
Calcium: 9.2 mg/dL (ref 8.4–10.5)
Chloride: 106 mEq/L (ref 96–112)
GFR: 82.93 mL/min (ref >60.00)
Glucose, Bld: 92 mg/dL (ref 70–99)
Potassium: 4.2 mEq/L (ref 3.5–5.1)
Sodium: 142 mEq/L (ref 135–145)

## 2016-01-13 LAB — CBC WITH DIFFERENTIAL/PLATELET
BASOS PCT: 0.6 % (ref 0.0–3.0)
Basophils Absolute: 0 10*3/uL (ref 0.0–0.1)
EOS ABS: 0.3 10*3/uL (ref 0.0–0.7)
Eosinophils Relative: 6.8 % — ABNORMAL HIGH (ref 0.0–5.0)
HEMATOCRIT: 41.8 % (ref 36.0–46.0)
Hemoglobin: 14.4 g/dL (ref 12.0–15.0)
LYMPHS PCT: 28.4 % (ref 12.0–46.0)
Lymphs Abs: 1.3 10*3/uL (ref 0.7–4.0)
MCHC: 34.5 g/dL (ref 30.0–36.0)
MCV: 89.2 fl (ref 78.0–100.0)
MONO ABS: 0.5 10*3/uL (ref 0.1–1.0)
Monocytes Relative: 10.6 % (ref 3.0–12.0)
NEUTROS ABS: 2.5 10*3/uL (ref 1.4–7.7)
Neutrophils Relative %: 53.6 % (ref 43.0–77.0)
Platelets: 144 10*3/uL — ABNORMAL LOW (ref 150.0–400.0)
RBC: 4.68 Mil/uL (ref 3.87–5.11)
RDW: 12.6 % (ref 11.5–15.5)
WBC: 4.7 10*3/uL (ref 4.0–10.5)

## 2016-01-13 LAB — TSH: TSH: 3.26 u[IU]/mL (ref 0.35–4.50)

## 2016-01-13 NOTE — Progress Notes (Signed)
Subjective:    Patient ID: Jessica Ashley, female    DOB: 06-19-1934, 81 y.o.   MRN: [id number]  HPI Jessica Ashley is a 81 year old married female nonsmoker who comes in today for her annual visit. She  has a past medical history of Breast cancer ([place]) (2011); Cataract; Fuchs' corneal dystrophy; Osteopenia; Shingles; and Thyroid disease.  6 years ago she was found to have abnormal lesion left breast 9:00 by mammography. It was nonpalpable. She underwent a lumpectomy and postop radiation  She's done well no recurrence. Follows up with oncology yearly  She uses drops because of a history of glaucoma  She gets routine eye care, dental care, BSE monthly, and you mammography, Is up to date on  colonoscopies  Vaccination update  Cognitive function normal she does not walk on a daily basis home health safety reviewed no issues identified, no guns in the house, she does have a health care power of attorney and living well   Review of Systems  Constitutional: Negative.   HENT: Negative.   Eyes: Negative.   Respiratory: Negative.   Cardiovascular: Negative.   Gastrointestinal: Negative.   Endocrine: Negative.   Genitourinary: Negative.   Musculoskeletal: Positive for arthralgias and myalgias. Negative for back pain, gait problem, neck pain and neck stiffness.  Skin: Negative.   Allergic/Immunologic: Negative.   Neurological: Negative.   Hematological: Negative.   Psychiatric/Behavioral: Negative.   All other systems reviewed and are negative.  Past Medical History:  Diagnosis Date  . Breast cancer ([place]) 2011  . Cataract   . Fuchs' corneal dystrophy   . Osteopenia   . Shingles   . Thyroid disease    thyroid surgery    Social History   Social History  . Marital status: Married    Spouse name: N/A  . Number of children: N/A  . Years of education: N/A   Occupational History  . Not on file.   Social History Main Topics  . Smoking status: Never Smoker  . Smokeless  tobacco: Never Used  . Alcohol use No  . Drug use: No  . Sexual activity: Yes    Partners: Male     Comment: married   Other Topics Concern  . Not on file   Social History Narrative   Retired    Married for 62 years    Two daughters ( both live locally)    Two granddaughters - both at [employer] to read and cross stitch. Likes to garden as well.     Past Surgical History:  Procedure Laterality Date  . BREAST LUMPECTOMY  2011  . CHOLECYSTECTOMY  2011  . DILATION AND CURETTAGE, DIAGNOSTIC / THERAPEUTIC  1993   bladder ?  . TONSILLECTOMY  1953    Family History  Problem Relation Age of Onset  . Cancer Sister 61    bladder  . Pneumonia Mother   . Heart disease Mother     Allergies  Allergen Reactions  . Chlorhexidine Gluconate Swelling and Rash  . Sulfur Swelling    Face and possibly entire body if allowed without intervention.  Marland Kitchen Dextroamphetamine Sulfate     Per patient she is unaware of reaction to this medication.  . Sulfa Antibiotics Swelling  . Penicillins Rash    Where injected    Current Outpatient Prescriptions on File Prior to Visit  Medication Sig Dispense Refill  . aspirin 81 MG tablet Take 81 mg by mouth daily.      Marland Kitchen  Calcium Carbonate-Vitamin D (CALCIUM-VITAMIN D3 PO) Take 1 tablet by mouth daily.     . Cholecalciferol (VITAMIN D) 2000 UNITS CAPS Take by mouth.      . timolol (TIMOPTIC) 0.5 % ophthalmic solution Place 1 drop into both eyes daily.      No current facility-administered medications on file prior to visit.     BP 102/62   Temp 98.2 F (36.8 C) (Oral)   Ht 4\' 11"  (1.499 m)   Wt 133 lb 12.8 oz (60.7 kg)   BMI 27.02 kg/m       Objective:   Physical Exam  Constitutional: She is oriented to person, place, and time. She appears well-developed and well-nourished. No distress.  HENT:  Head: Normocephalic and atraumatic.  Right Ear: External ear normal.  Left Ear: External ear normal.  Nose: Nose normal.  Mouth/Throat:  Oropharynx is clear and moist. No oropharyngeal exudate.  Eyes: Conjunctivae and EOM are normal. Pupils are equal, round, and reactive to light. Right eye exhibits no discharge. Left eye exhibits no discharge. No scleral icterus.  Neck: Normal range of motion. Neck supple. No JVD present. Carotid bruit is not present. No tracheal deviation present. No thyroid mass and no thyromegaly present.  Cardiovascular: Normal rate, regular rhythm, normal heart sounds and intact distal pulses.  Exam reveals no gallop and no friction rub.   No murmur heard. Pulmonary/Chest: Effort normal and breath sounds normal. No stridor. No respiratory distress. She has no wheezes. She has no rales. She exhibits no tenderness.  Abdominal: Soft. Bowel sounds are normal. She exhibits no distension and no mass. There is no tenderness. There is no rebound and no guarding.  Genitourinary:  Genitourinary Comments: She refused breast exam   Musculoskeletal: Normal range of motion.  Lymphadenopathy:    She has no cervical adenopathy.  Neurological: She is alert and oriented to person, place, and time. She has normal reflexes. She displays normal reflexes. No cranial nerve deficit. She exhibits normal muscle tone. Coordination normal.  Skin: Skin is warm and dry. No rash noted. She is not diaphoretic. No erythema. No pallor.  Psychiatric: She has a normal mood and affect. Her behavior is normal. Judgment and thought content normal.  Nursing note and vitals reviewed.     Assessment & Plan:  1. Thyroid disease - CBC with Differential/Platelet - Basic metabolic panel - TSH  2. Malignant neoplasm of left female breast, unspecified site of breast ([place]) - CBC with Differential/Platelet - Basic metabolic panel - TSH - Keep appointments with oncology   Health Maintenance - Continue to stay healthy and exercise - Heart healthy diet - Follow up in one year or sooner if needed  Dorothyann Peng, NP

## 2016-01-13 NOTE — Patient Instructions (Addendum)
It was great seeing you today!  I will follow up with you regarding your blood work  Continue to walk and stay active. If you need anything, please let me know.   I will see you in one year  Health Maintenance, Female Adopting a healthy lifestyle and getting preventive care can go a long way to promote health and wellness. Talk with your health care provider about what schedule of regular examinations is right for you. This is a good chance for you to check in with your provider about disease prevention and staying healthy. In between checkups, there are plenty of things you can do on your own. Experts have done a lot of research about which lifestyle changes and preventive measures are most likely to keep you healthy. Ask your health care provider for more information. WEIGHT AND DIET  Eat a healthy diet  Be sure to include plenty of vegetables, fruits, low-fat dairy products, and lean protein.  Do not eat a lot of foods high in solid fats, added sugars, or salt.  Get regular exercise. This is one of the most important things you can do for your health.  Most adults should exercise for at least 150 minutes each week. The exercise should increase your heart rate and make you sweat (moderate-intensity exercise).  Most adults should also do strengthening exercises at least twice a week. This is in addition to the moderate-intensity exercise.  Maintain a healthy weight  Body mass index (BMI) is a measurement that can be used to identify possible weight problems. It estimates body fat based on height and weight. Your health care provider can help determine your BMI and help you achieve or maintain a healthy weight.  For females 45 years of age and older:   A BMI below 18.5 is considered underweight.  A BMI of 18.5 to 24.9 is normal.  A BMI of 25 to 29.9 is considered overweight.  A BMI of 30 and above is considered obese.  Watch levels of cholesterol and blood lipids  You should  start having your blood tested for lipids and cholesterol at 79 years of age, then have this test every 5 years.  You may need to have your cholesterol levels checked more often if:  Your lipid or cholesterol levels are high.  You are older than 79 years of age.  You are at high risk for heart disease.  CANCER SCREENING   Lung Cancer  Lung cancer screening is recommended for adults 36-42 years old who are at high risk for lung cancer because of a history of smoking.  A yearly low-dose CT scan of the lungs is recommended for people who:  Currently smoke.  Have quit within the past 15 years.  Have at least a 30-pack-year history of smoking. A pack year is smoking an average of one pack of cigarettes a day for 1 year.  Yearly screening should continue until it has been 15 years since you quit.  Yearly screening should stop if you develop a health problem that would prevent you from having lung cancer treatment.  Breast Cancer  Practice breast self-awareness. This means understanding how your breasts normally appear and feel.  It also means doing regular breast self-exams. Let your health care provider know about any changes, no matter how small.  If you are in your 20s or 30s, you should have a clinical breast exam (CBE) by a health care provider every 1-3 years as part of a regular health exam.  If you are 40 or older, have a CBE every year. Also consider having a breast X-ray (mammogram) every year.  If you have a family history of breast cancer, talk to your health care provider about genetic screening.  If you are at high risk for breast cancer, talk to your health care provider about having an MRI and a mammogram every year.  Breast cancer gene (BRCA) assessment is recommended for women who have family members with BRCA-related cancers. BRCA-related cancers include:  Breast.  Ovarian.  Tubal.  Peritoneal cancers.  Results of the assessment will determine the  need for genetic counseling and BRCA1 and BRCA2 testing. Cervical Cancer Your health care provider may recommend that you be screened regularly for cancer of the pelvic organs (ovaries, uterus, and vagina). This screening involves a pelvic examination, including checking for microscopic changes to the surface of your cervix (Pap test). You may be encouraged to have this screening done every 3 years, beginning at age 21.  For women ages 30-65, health care providers may recommend pelvic exams and Pap testing every 3 years, or they may recommend the Pap and pelvic exam, combined with testing for human papilloma virus (HPV), every 5 years. Some types of HPV increase your risk of cervical cancer. Testing for HPV may also be done on women of any age with unclear Pap test results.  Other health care providers may not recommend any screening for nonpregnant women who are considered low risk for pelvic cancer and who do not have symptoms. Ask your health care provider if a screening pelvic exam is right for you.  If you have had past treatment for cervical cancer or a condition that could lead to cancer, you need Pap tests and screening for cancer for at least 20 years after your treatment. If Pap tests have been discontinued, your risk factors (such as having a new sexual partner) need to be reassessed to determine if screening should resume. Some women have medical problems that increase the chance of getting cervical cancer. In these cases, your health care provider may recommend more frequent screening and Pap tests. Colorectal Cancer  This type of cancer can be detected and often prevented.  Routine colorectal cancer screening usually begins at 79 years of age and continues through 79 years of age.  Your health care provider may recommend screening at an earlier age if you have risk factors for colon cancer.  Your health care provider may also recommend using home test kits to check for hidden blood in  the stool.  A small camera at the end of a tube can be used to examine your colon directly (sigmoidoscopy or colonoscopy). This is done to check for the earliest forms of colorectal cancer.  Routine screening usually begins at age 50.  Direct examination of the colon should be repeated every 5-10 years through 79 years of age. However, you may need to be screened more often if early forms of precancerous polyps or small growths are found. Skin Cancer  Check your skin from head to toe regularly.  Tell your health care provider about any new moles or changes in moles, especially if there is a change in a mole's shape or color.  Also tell your health care provider if you have a mole that is larger than the size of a pencil eraser.  Always use sunscreen. Apply sunscreen liberally and repeatedly throughout the day.  Protect yourself by wearing long sleeves, pants, a wide-brimmed hat, and sunglasses whenever you   are outside. HEART DISEASE, DIABETES, AND HIGH BLOOD PRESSURE   High blood pressure causes heart disease and increases the risk of stroke. High blood pressure is more likely to develop in:  People who have blood pressure in the high end of the normal range (130-139/85-89 mm Hg).  People who are overweight or obese.  People who are African American.  If you are 18-39 years of age, have your blood pressure checked every 3-5 years. If you are 40 years of age or older, have your blood pressure checked every year. You should have your blood pressure measured twice--once when you are at a hospital or clinic, and once when you are not at a hospital or clinic. Record the average of the two measurements. To check your blood pressure when you are not at a hospital or clinic, you can use:  An automated blood pressure machine at a pharmacy.  A home blood pressure monitor.  If you are between 55 years and 79 years old, ask your health care provider if you should take aspirin to prevent  strokes.  Have regular diabetes screenings. This involves taking a blood sample to check your fasting blood sugar level.  If you are at a normal weight and have a low risk for diabetes, have this test once every three years after 79 years of age.  If you are overweight and have a high risk for diabetes, consider being tested at a younger age or more often. PREVENTING INFECTION  Hepatitis B  If you have a higher risk for hepatitis B, you should be screened for this virus. You are considered at high risk for hepatitis B if:  You were born in a country where hepatitis B is common. Ask your health care provider which countries are considered high risk.  Your parents were born in a high-risk country, and you have not been immunized against hepatitis B (hepatitis B vaccine).  You have HIV or AIDS.  You use needles to inject street drugs.  You live with someone who has hepatitis B.  You have had sex with someone who has hepatitis B.  You get hemodialysis treatment.  You take certain medicines for conditions, including cancer, organ transplantation, and autoimmune conditions. Hepatitis C  Blood testing is recommended for:  Everyone born from 1945 through 1965.  Anyone with known risk factors for hepatitis C. Sexually transmitted infections (STIs)  You should be screened for sexually transmitted infections (STIs) including gonorrhea and chlamydia if:  You are sexually active and are younger than 79 years of age.  You are older than 79 years of age and your health care provider tells you that you are at risk for this type of infection.  Your sexual activity has changed since you were last screened and you are at an increased risk for chlamydia or gonorrhea. Ask your health care provider if you are at risk.  If you do not have HIV, but are at risk, it may be recommended that you take a prescription medicine daily to prevent HIV infection. This is called pre-exposure prophylaxis  (PrEP). You are considered at risk if:  You are sexually active and do not regularly use condoms or know the HIV status of your partner(s).  You take drugs by injection.  You are sexually active with a partner who has HIV. Talk with your health care provider about whether you are at high risk of being infected with HIV. If you choose to begin PrEP, you should first be tested for   HIV. You should then be tested every 3 months for as long as you are taking PrEP.  PREGNANCY   If you are premenopausal and you may become pregnant, ask your health care provider about preconception counseling.  If you may become pregnant, take 400 to 800 micrograms (mcg) of folic acid every day.  If you want to prevent pregnancy, talk to your health care provider about birth control (contraception). OSTEOPOROSIS AND MENOPAUSE   Osteoporosis is a disease in which the bones lose minerals and strength with aging. This can result in serious bone fractures. Your risk for osteoporosis can be identified using a bone density scan.  If you are 74 years of age or older, or if you are at risk for osteoporosis and fractures, ask your health care provider if you should be screened.  Ask your health care provider whether you should take a calcium or vitamin D supplement to lower your risk for osteoporosis.  Menopause may have certain physical symptoms and risks.  Hormone replacement therapy may reduce some of these symptoms and risks. Talk to your health care provider about whether hormone replacement therapy is right for you.  HOME CARE INSTRUCTIONS   Schedule regular health, dental, and eye exams.  Stay current with your immunizations.   Do not use any tobacco products including cigarettes, chewing tobacco, or electronic cigarettes.  If you are pregnant, do not drink alcohol.  If you are breastfeeding, limit how much and how often you drink alcohol.  Limit alcohol intake to no more than 1 drink per day for  nonpregnant women. One drink equals 12 ounces of beer, 5 ounces of wine, or 1 ounces of hard liquor.  Do not use street drugs.  Do not share needles.  Ask your health care provider for help if you need support or information about quitting drugs.  Tell your health care provider if you often feel depressed.  Tell your health care provider if you have ever been abused or do not feel safe at home.   This information is not intended to replace advice given to you by your health care provider. Make sure you discuss any questions you have with your health care provider.   Document Released: 11/28/2010 Document Revised: 06/05/2014 Document Reviewed: 04/16/2013 Elsevier Interactive Patient Education [employer].

## 2016-02-22 ENCOUNTER — Ambulatory Visit (INDEPENDENT_AMBULATORY_CARE_PROVIDER_SITE_OTHER): Payer: Medicare Other

## 2016-02-22 DIAGNOSIS — Z23 Encounter for immunization: Secondary | ICD-10-CM | POA: Diagnosis not present

## 2016-05-04 ENCOUNTER — Other Ambulatory Visit: Payer: Self-pay | Admitting: Hematology and Oncology

## 2016-05-04 DIAGNOSIS — Z853 Personal history of malignant neoplasm of breast: Secondary | ICD-10-CM

## 2016-07-03 ENCOUNTER — Ambulatory Visit
Admission: RE | Admit: 2016-07-03 | Discharge: 2016-07-03 | Disposition: A | Discharge status: Home / Self Care | Payer: Medicare Other | Source: Ambulatory Visit | Attending: Hematology and Oncology | Admitting: Hematology and Oncology

## 2016-07-03 DIAGNOSIS — Z853 Personal history of malignant neoplasm of breast: Secondary | ICD-10-CM

## 2016-07-03 DIAGNOSIS — R928 Other abnormal and inconclusive findings on diagnostic imaging of breast: Secondary | ICD-10-CM | POA: Diagnosis not present

## 2016-07-03 HISTORY — DX: Personal history of irradiation: Z92.3

## 2016-10-11 ENCOUNTER — Telehealth: Payer: Self-pay

## 2016-10-11 NOTE — Telephone Encounter (Signed)
Call to Ms. Lamere and schedule AWV for June 13th at 1pm and her spouse Ermon at 2pm but will see together

## 2016-10-20 ENCOUNTER — Encounter: Payer: Self-pay | Admitting: Gastroenterology

## 2016-11-01 ENCOUNTER — Encounter: Payer: Medicare Other | Admitting: Nurse Practitioner

## 2016-11-01 ENCOUNTER — Encounter: Payer: Self-pay | Admitting: Adult Health

## 2016-11-01 ENCOUNTER — Ambulatory Visit (HOSPITAL_BASED_OUTPATIENT_CLINIC_OR_DEPARTMENT_OTHER): Payer: Medicare Other | Admitting: Adult Health

## 2016-11-01 VITALS — BP 125/69 | HR 66 | Temp 97.6°F | Resp 18 | Ht 59.0 in | Wt 123.6 lb

## 2016-11-01 DIAGNOSIS — Z170 Estrogen receptor positive status [ER+]: Principal | ICD-10-CM

## 2016-11-01 DIAGNOSIS — Z853 Personal history of malignant neoplasm of breast: Secondary | ICD-10-CM | POA: Diagnosis not present

## 2016-11-01 DIAGNOSIS — M8580 Other specified disorders of bone density and structure, unspecified site: Secondary | ICD-10-CM

## 2016-11-01 DIAGNOSIS — R21 Rash and other nonspecific skin eruption: Secondary | ICD-10-CM

## 2016-11-01 DIAGNOSIS — C50912 Malignant neoplasm of unspecified site of left female breast: Secondary | ICD-10-CM

## 2016-11-01 NOTE — Progress Notes (Signed)
CLINIC:  Survivorship   REASON FOR VISIT:  Routine follow-up for history of breast cancer.   BRIEF ONCOLOGIC HISTORY:    Breast cancer, left breast ([place])   07/16/2009 Surgery    Left breast lumpectomy: Invasive ductal carcinoma, ER 98%, PR 100%, Ki-67 8%, HER-2 negative ratio 1.4      07/16/2009 Pathologic Stage    Stage IA: T1c N0      08/12/2009 - 09/21/2009 Radiation Therapy    Adjuvant radiation therapy      09/26/2009 - 09/01/2014 Anti-estrogen oral therapy    Multiple anti-estrogen therapies were tried and finally completed 5 years of tamoxifen        INTERVAL HISTORY:  Jessica Ashley presents to the [facility] today for routine follow-up for her history of breast cancer.  Overall, she reports feeling quite well. She is not having any breast issues.  She sees her PCP annually.  She is no longer needing pap smears or colonoscopy due to her age.  She does not exercise.  She has had a rash with welts on her ankles she is concerned about that popped up a couple of weeks ago.  She is applying benadryl ointment and it is slowly improving.  She wants suggestions of what else to do about this.      REVIEW OF SYSTEMS:  Review of Systems  Constitutional: Negative for appetite change, chills, diaphoresis, fatigue, fever and unexpected weight change.  HENT:   Negative for hearing loss and lump/mass.   Eyes: Negative for eye problems and icterus.  Respiratory: Negative for chest tightness, cough and shortness of breath.   Cardiovascular: Negative for chest pain, leg swelling and palpitations.  Gastrointestinal: Negative for abdominal distention and abdominal pain.  Endocrine: Negative for hot flashes.  Genitourinary: Negative for difficulty urinating.   Musculoskeletal: Negative for arthralgias.  Skin: Positive for itching and rash.  Neurological: Negative for dizziness, extremity weakness and headaches.  Hematological: Negative for adenopathy. Does not bruise/bleed easily.    Psychiatric/Behavioral: Negative for depression. The patient is not nervous/anxious.   Breast: Denies any new nodularity, masses, tenderness, nipple changes, or nipple discharge.     PAST MEDICAL/SURGICAL HISTORY:  Past Medical History:  Diagnosis Date  . Breast cancer ([place]) 2011  . Cataract   . Fuchs' corneal dystrophy   . Osteopenia   . Personal history of radiation therapy   . Shingles   . Thyroid disease    thyroid surgery   Past Surgical History:  Procedure Laterality Date  . BREAST BIOPSY Left 06/2015   benign stereotactic   . BREAST LUMPECTOMY  2011  . CHOLECYSTECTOMY  2011  . DILATION AND CURETTAGE, DIAGNOSTIC / THERAPEUTIC  1993   bladder ?  . TONSILLECTOMY  1953     ALLERGIES:  Allergies  Allergen Reactions  . Chlorhexidine Gluconate Swelling and Rash  . Sulfur Swelling    Face and possibly entire body if allowed without intervention.  Marland Kitchen Dextroamphetamine Sulfate     Per patient she is unaware of reaction to this medication.  . Sulfa Antibiotics Swelling  . Penicillins Rash    Where injected     CURRENT MEDICATIONS:  Outpatient Encounter Prescriptions as of 11/01/2016  Medication Sig Note  . Calcium Carbonate-Vitamin D (CALCIUM-VITAMIN D3 PO) Take 1 tablet by mouth daily.    . Cholecalciferol (VITAMIN D) 2000 UNITS CAPS Take by mouth.     . timolol (TIMOPTIC) 0.5 % ophthalmic solution Place 1 drop into both eyes daily.  08/25/2013:  Received from: External Pharmacy  . aspirin 81 MG tablet Take 81 mg by mouth daily.      No facility-administered encounter medications on file as of 11/01/2016.      ONCOLOGIC FAMILY HISTORY:  Family History  Problem Relation Age of Onset  . Cancer Sister 18       bladder  . Pneumonia Mother   . Heart disease Mother     GENETIC COUNSELING/TESTING: Not indicated  SOCIAL HISTORY:  ANAYAH ARVANITIS is married and lives with husband in [place], [place].  She has 2 children and they live in [place].  Ms.  Shampine is currently retired.  She denies any current or history of tobacco, alcohol, or illicit drug use.     PHYSICAL EXAMINATION:  Vital Signs: Vitals:   11/01/16 1204  BP: 125/69  Pulse: 66  Resp: 18  Temp: 97.6 F (36.4 C)   Filed Weights   11/01/16 1204  Weight: 123 lb 9.6 oz (56.1 kg)   General: Well-nourished, well-appearing female in no acute distress.  Unaccompanied today.   HEENT: Head is normocephalic.  Pupils equal and reactive to light. Conjunctivae clear without exudate.  Sclerae anicteric. Oral mucosa is pink, moist.  Oropharynx is pink without lesions or erythema.  Lymph: No cervical, supraclavicular, or infraclavicular lymphadenopathy noted on palpation.  Cardiovascular: Regular rate and rhythm.Marland Kitchen Respiratory: Clear to auscultation bilaterally. Chest expansion symmetric; breathing non-labored.  Breast Exam:  -Left breast: No appreciable masses on palpation. No skin redness, thickening, or peau d'orange appearance; no nipple retraction or nipple discharge; mild distortion in symmetry at previous lumpectomy site well healed scar without erythema or nodularity.  -Right breast: No appreciable masses on palpation. No skin redness, thickening, or peau d'orange appearance; no nipple retraction or nipple discharge;  -Axilla: No axillary adenopathy bilaterally.  GI: Abdomen soft and round; non-tender, non-distended. Bowel sounds normoactive. No hepatosplenomegaly.   GU: Deferred.  Neuro: No focal deficits. Steady gait.  Psych: Mood and affect normal and appropriate for situation.  Extremities: No edema. Skin: Warm and dry.  LABORATORY DATA:  None for this visit   DIAGNOSTIC IMAGING:  Most recent mammogram:  -Mammogram     ASSESSMENT AND PLAN:  Ms.. Nester is a pleasant 82 y.o. female with history of Stage IA left breast invasive ductal carcinoma, ER+/PR+/HER2-, diagnosed in 06/2009, treated with lumpectomy, adjuvant radiation therapy, and anti-estrogen therapy with  Tamoxifen x 5 years finishing in 08/2014.  She [facility] for surveillance and routine follow-up.   1. History of breast cancer:  Ms. Milliner is currently clinically and radiographically without evidence of disease or recurrence of breast cancer. She will be due for mammogram in 07/03/2017; orders placed today.   I encouraged her to call me with any questions or concerns before her next visit at the cancer center, and I would be happy to see her sooner, if needed.    2. Skin rash: I recommended she continue to appy benadryl ointment in addition to hydrocortisone ointment and if not improving f/u with her PCP about this for further testing and/or treatment.    3. Bone health:  Given Ms. Schmall's age, history of breast cancer, she is at risk for bone demineralization. Her last DEXA scan was in 2012 and demonstrated osteopenia.  I recommended she discuss further testing with her PCP.   In the meantime, she was encouraged to increase her consumption of foods rich in calcium, as well as increase her weight-bearing activities.  She  was given education on specific food and activities to promote bone health.  4. Cancer screening:  Due to Ms. Mcgowen's history and her age, she should receive screening for skin cancers. She was encouraged to follow-up with her PCP for appropriate cancer screenings.   5. Health maintenance and wellness promotion: Ms. Treese was encouraged to consume 5-7 servings of fruits and vegetables per day. She was also encouraged to engage in moderate to vigorous exercise for 30 minutes per day most days of the week. She was instructed to limit her alcohol consumption and continue to abstain from tobacco use/was encouraged stop smoking.      Dispo:  -Return to cancer center in one year for LTS follow up.   -Mammogram in 06/2017 when due   A total of (30) minutes of face-to-face time was spent with this patient with greater than 50% of that time in counseling and  care-coordination.   Gardenia Phlegm, NP Survivorship Program [facility] [phone]   Note: PRIMARY CARE PROVIDER Dorothyann Peng, [place] [phone]

## 2016-11-07 NOTE — Progress Notes (Signed)
Subjective:   Jessica Ashley is a 82 y.o. female who presents for Medicare Annual (Subsequent) preventive examination.  She presents today with a rash around her bilateral ankles and reports a 'bite' on either side of upper leg as well (also see oncology note from 11/01/16). She has used PO and topical Benadryl and reports that it is getting better. She plans to follow-up w/ PCP if any worsening/recurring symptoms.  She has not been taking her OTC PO medications for 3 weeks because she was concerned that's what was causing the rash. She plans to restart medication next week.  She had a fall right before Christmas and hit her face and broke a tooth that ended up having to be removed. She was cutting through the yard and stumbled over something and fell. Normally, she does not have any problems with balance or falls and is not quite sure what happened in this instance.  Review of Systems:  No ROS.  Medicare Wellness Visit. Additional risk factors are reflected in the social history.  Cardiac Risk Factors include: advanced age (>17men, >20 women)  Sleep patterns: Generally, sleeps okay, but has had a hard time over the last few weeks.  She has been waking up early at 4am, but does not get up. Goes to bed around 12:30am. Occasionally takes Benadryl or Motrin at night to sleep. Does not get up to void. Does not nap during the day.  Home Safety/Smoke Alarms: Feels safe in home. Smoke alarms in place. Security system in place.  Living environment; residence and Firearm Safety: Lives w/ husband.  1-story house/ trailer, firearms stored safely. Seat Belt Safety/Bike Helmet: Wears seat belt.   Counseling:   Eye Exam- Follows w/ Dr. Ellie Lunch. Has upcoming cataract surgery planned this summer.   Dental- Follows w/ Dr. Garry Heater  Female:   Pap- Aged out [place]- Aged out Dexa scan- last 04/10/11. Osteopenia.  CCS- Aged out     Objective:     Vitals: BP 102/62   Pulse 69   Ht 4' 10.5"  (1.486 m)   Wt 124 lb (56.2 kg)   SpO2 98%   BMI 25.47 kg/m   Body mass index is 25.47 kg/m.  BP Readings from Last 3 Encounters:  11/08/16 102/62  11/01/16 125/69  01/13/16 102/62    Tobacco History  Smoking Status  . Never Smoker  Smokeless Tobacco  . Never Used     Counseling given: Not Answered   Past Medical History:  Diagnosis Date  . Breast cancer ([place]) 2011  . Cataract   . Fuchs' corneal dystrophy   . Osteopenia   . Personal history of radiation therapy   . Shingles   . Thyroid disease    thyroid surgery   Past Surgical History:  Procedure Laterality Date  . BREAST BIOPSY Left 06/2015   benign stereotactic   . BREAST LUMPECTOMY  2011  . CHOLECYSTECTOMY  2011  . DILATION AND CURETTAGE, DIAGNOSTIC / THERAPEUTIC  1993   bladder ?  . TONSILLECTOMY  1953   Family History  Problem Relation Age of Onset  . Cancer Sister 59       bladder  . Pneumonia Mother   . Heart disease Mother    History  Sexual Activity  . Sexual activity: No    Comment: married    Outpatient Encounter Prescriptions as of 11/08/2016  Medication Sig  . DiphenhydrAMINE HCl (BENADRYL PO) Take 1 tablet by mouth at bedtime as needed.  . Ibuprofen (  MOTRIN PO) Take 1 tablet by mouth at bedtime as needed.  . timolol (TIMOPTIC) 0.5 % ophthalmic solution Place 1 drop into both eyes daily.   Marland Kitchen aspirin 81 MG tablet Take 81 mg by mouth daily.    . Calcium Carbonate-Vitamin D (CALCIUM-VITAMIN D3 PO) Take 1 tablet by mouth daily.   . Cholecalciferol (VITAMIN D) 2000 UNITS CAPS Take by mouth.     No facility-administered encounter medications on file as of 11/08/2016.     Activities of Daily Living In your present state of health, do you have any difficulty performing the following activities: 11/08/2016  Hearing? N  Vision? N  Difficulty concentrating or making decisions? N  Walking or climbing stairs? N  Dressing or bathing? N  Doing errands, shopping? N  Preparing Food and eating ? N   Using the Toilet? N  In the past six months, have you accidently leaked urine? Y  Do you have problems with loss of bowel control? Y  Managing your Medications? N  Managing your Finances? N  Housekeeping or managing your Housekeeping? N  Some recent data might be hidden    Patient Care Team: Dorothyann Peng, NP as PCP - General (Family Medicine) Delice Bison, Charlestine Massed, NP as Nurse Practitioner (Hematology and Oncology) Luberta Mutter, MD as Consulting Physician (Ophthalmology) Garry Heater, DDS as Consulting Physician (Dentistry)    Assessment:    Physical assessment deferred to PCP.  Exercise Activities and Dietary recommendations Current Exercise Habits: The patient does not participate in regular exercise at present  Diet (meal preparation, eat out, water intake, caffeinated beverages, dairy products, fruits and vegetables): in general, a "healthy" diet  , well balanced, on average, 3 meals per day. Describes diet as 'not real good.' Generally eats a light breakfast and lunch. 'I eat too many starches but they keep me from being hungry.'- On further discussion, she does not actually feel she eats too many starches, but her family has told her that she does. Eats meat, vegetable, and potato for dinner. Drinks 4-5 bottles of water daily.     Goals    . Maintain current health status      Fall Risk Fall Risk  11/08/2016 10/12/2015 10/13/2014 03/02/2014 09/17/2013  Falls in the past year? Yes No No No No  Number falls in past yr: 1 - - - -  Follow up Falls prevention discussed - - - -   Depression Screen PHQ 2/9 Scores 11/08/2016 10/12/2015 10/13/2014 09/17/2013  PHQ - 2 Score 0 0 0 0     Cognitive Function MMSE - Mini Mental State Exam 11/08/2016  Orientation to time 5  Orientation to Place 5  Registration 3  Attention/ Calculation 5  Recall 3  Language- name 2 objects 2  Language- repeat 1  Language- follow 3 step command 3  Language- read & follow direction 1  Write  a sentence 1  Copy design 1  Total score 30        Immunization History  Administered Date(s) Administered  . Influenza Split 03/01/2011, 02/16/2012  . Influenza Whole 03/08/2007, 03/03/2008, 03/04/2009, 03/03/2010  . Influenza, High Dose Seasonal PF 03/05/2013, 03/02/2015, 02/22/2016  . Influenza,inj,Quad PF,36+ Mos 02/20/2014  . Pneumococcal Conjugate-13 09/17/2013  . Pneumococcal Polysaccharide-23 06/04/2009  . Td 05/29/2006   Screening Tests Health Maintenance  Topic Date Due  . TETANUS/TDAP  05/29/2016  . INFLUENZA VACCINE  12/27/2016  . DEXA SCAN  Completed  . PNA vac Low Risk Adult  Completed  Plan:    Follow-up w/ PCP as scheduled.   Create and bring a copy of your advance directives to your next office visit.  Eat heart healthy diet (full of fruits, vegetables, whole grains, lean protein, water--limit salt, fat, and sugar intake) and increase physical activity as tolerated.  Orders placed for DEXA- patient does feel her bones may have worsened since last check and is agreeable to follow-up screening.  I have personally reviewed and noted the following in the patient's chart:   . Medical and social history . Use of alcohol, tobacco or illicit drugs  . Current medications and supplements . Functional ability and status . Nutritional status . Physical activity . Advanced directives . List of other physicians . Vitals . Screenings to include cognitive, depression, and falls . Referrals and appointments  In addition, I have reviewed and discussed with patient certain preventive protocols, quality metrics, and best practice recommendations. A written personalized care plan for preventive services as well as general preventive health recommendations were provided to patient.     Dorrene German, RN  11/08/2016

## 2016-11-08 ENCOUNTER — Ambulatory Visit (INDEPENDENT_AMBULATORY_CARE_PROVIDER_SITE_OTHER): Payer: Medicare Other

## 2016-11-08 VITALS — BP 102/62 | HR 69 | Ht 58.5 in | Wt 124.0 lb

## 2016-11-08 DIAGNOSIS — Z0000 Encounter for general adult medical examination without abnormal findings: Secondary | ICD-10-CM

## 2016-11-08 DIAGNOSIS — Z780 Asymptomatic menopausal state: Secondary | ICD-10-CM

## 2016-11-08 NOTE — Patient Instructions (Addendum)
Jessica Ashley , Thank you for taking time to come for your Medicare Wellness Visit. I appreciate your ongoing commitment to your health goals. Please review the following plan we discussed and let me know if I can assist you in the future.   Consider the new shingles vaccine, Shringrix. This is available at most pharmacies.   Best of luck with your eye surgery!  These are the goals we discussed: Goals    . Maintain current health status       This is a list of the screening recommended for you and due dates:  Health Maintenance  Topic Date Due  . Tetanus Vaccine  05/29/2016  . Flu Shot  12/27/2016  . DEXA scan (bone density measurement)  Completed  . Pneumonia vaccines  Completed    Preventive Care 80 Years and Older, Female Preventive care refers to lifestyle choices and visits with your health care provider that can promote health and wellness. What does preventive care include?  A yearly physical exam. This is also called an annual well check.  Dental exams once or twice a year.  Routine eye exams. Ask your health care provider how often you should have your eyes checked.  Personal lifestyle choices, including: ? Daily care of your teeth and gums. ? Regular physical activity. ? Eating a healthy diet. ? Avoiding tobacco and drug use. ? Limiting alcohol use. ? Practicing safe sex. ? Taking low-dose aspirin every day. ? Taking vitamin and mineral supplements as recommended by your health care provider. What happens during an annual well check? The services and screenings done by your health care provider during your annual well check will depend on your age, overall health, lifestyle risk factors, and family history of disease. Counseling Your health care provider may ask you questions about your:  Alcohol use.  Tobacco use.  Drug use.  Emotional well-being.  Home and relationship well-being.  Sexual activity.  Eating habits.  History of falls.  Memory and  ability to understand (cognition).  Work and work Statistician.  Reproductive health.  Screening You may have the following tests or measurements:  Height, weight, and BMI.  Blood pressure.  Lipid and cholesterol levels. These may be checked every 5 years, or more frequently if you are over 59 years old.  Skin check.  Lung cancer screening. You may have this screening every year starting at age 54 if you have a 30-pack-year history of smoking and currently smoke or have quit within the past 15 years.  Fecal occult blood test (FOBT) of the stool. You may have this test every year starting at age 78.  Flexible sigmoidoscopy or colonoscopy. You may have a sigmoidoscopy every 5 years or a colonoscopy every 10 years starting at age 71.  Hepatitis C blood test.  Hepatitis B blood test.  Sexually transmitted disease (STD) testing.  Diabetes screening. This is done by checking your blood sugar (glucose) after you have not eaten for a while (fasting). You may have this done every 1-3 years.  Bone density scan. This is done to screen for osteoporosis. You may have this done starting at age 53.  Mammogram. This may be done every 1-2 years. Talk to your health care provider about how often you should have regular mammograms.  Talk with your health care provider about your test results, treatment options, and if necessary, the need for more tests. Vaccines Your health care provider may recommend certain vaccines, such as:  Influenza vaccine. This is recommended every  year.  Tetanus, diphtheria, and acellular pertussis (Tdap, Td) vaccine. You may need a Td booster every 10 years.  Varicella vaccine. You may need this if you have not been vaccinated.  Zoster vaccine. You may need this after age 16.  Measles, mumps, and rubella (MMR) vaccine. You may need at least one dose of MMR if you were born in 1957 or later. You may also need a second dose.  Pneumococcal 13-valent conjugate  (PCV13) vaccine. One dose is recommended after age 34.  Pneumococcal polysaccharide (PPSV23) vaccine. One dose is recommended after age 20.  Meningococcal vaccine. You may need this if you have certain conditions.  Hepatitis A vaccine. You may need this if you have certain conditions or if you travel or work in places where you may be exposed to hepatitis A.  Hepatitis B vaccine. You may need this if you have certain conditions or if you travel or work in places where you may be exposed to hepatitis B.  Haemophilus influenzae type b (Hib) vaccine. You may need this if you have certain conditions.  Talk to your health care provider about which screenings and vaccines you need and how often you need them. This information is not intended to replace advice given to you by your health care provider. Make sure you discuss any questions you have with your health care provider. Document Released: 06/11/2015 Document Revised: 02/02/2016 Document Reviewed: 03/16/2015 Elsevier Interactive Patient Education  2017 [employer].

## 2016-11-08 NOTE — Progress Notes (Signed)
I have reviewed and agree with this plan  

## 2016-11-21 DIAGNOSIS — H353111 Nonexudative age-related macular degeneration, right eye, early dry stage: Secondary | ICD-10-CM | POA: Diagnosis not present

## 2016-11-21 DIAGNOSIS — H35351 Cystoid macular degeneration, right eye: Secondary | ICD-10-CM | POA: Diagnosis not present

## 2016-11-21 DIAGNOSIS — H2513 Age-related nuclear cataract, bilateral: Secondary | ICD-10-CM | POA: Diagnosis not present

## 2016-11-21 DIAGNOSIS — H52203 Unspecified astigmatism, bilateral: Secondary | ICD-10-CM | POA: Diagnosis not present

## 2016-12-07 ENCOUNTER — Other Ambulatory Visit: Payer: Self-pay | Admitting: Adult Health

## 2016-12-07 DIAGNOSIS — H2512 Age-related nuclear cataract, left eye: Secondary | ICD-10-CM | POA: Diagnosis not present

## 2016-12-07 DIAGNOSIS — H25812 Combined forms of age-related cataract, left eye: Secondary | ICD-10-CM | POA: Diagnosis not present

## 2016-12-07 DIAGNOSIS — M8580 Other specified disorders of bone density and structure, unspecified site: Secondary | ICD-10-CM

## 2016-12-22 ENCOUNTER — Other Ambulatory Visit: Payer: Self-pay

## 2016-12-25 ENCOUNTER — Ambulatory Visit
Admission: RE | Admit: 2016-12-25 | Discharge: 2016-12-25 | Disposition: A | Discharge status: Home / Self Care | Payer: Medicare Other | Source: Ambulatory Visit | Attending: Adult Health | Admitting: Adult Health

## 2016-12-25 DIAGNOSIS — Z780 Asymptomatic menopausal state: Secondary | ICD-10-CM | POA: Diagnosis not present

## 2016-12-25 DIAGNOSIS — M8580 Other specified disorders of bone density and structure, unspecified site: Secondary | ICD-10-CM

## 2016-12-25 DIAGNOSIS — M8589 Other specified disorders of bone density and structure, multiple sites: Secondary | ICD-10-CM | POA: Diagnosis not present

## 2017-01-17 ENCOUNTER — Ambulatory Visit (INDEPENDENT_AMBULATORY_CARE_PROVIDER_SITE_OTHER): Payer: Medicare Other | Admitting: Adult Health

## 2017-01-17 VITALS — BP 110/64 | Temp 98.3°F | Ht 59.0 in | Wt 124.0 lb

## 2017-01-17 DIAGNOSIS — E559 Vitamin D deficiency, unspecified: Secondary | ICD-10-CM | POA: Diagnosis not present

## 2017-01-17 DIAGNOSIS — E079 Disorder of thyroid, unspecified: Secondary | ICD-10-CM | POA: Diagnosis not present

## 2017-01-17 DIAGNOSIS — M8589 Other specified disorders of bone density and structure, multiple sites: Secondary | ICD-10-CM | POA: Diagnosis not present

## 2017-01-17 LAB — CBC WITH DIFFERENTIAL/PLATELET
Basophils Absolute: 0 10*3/uL (ref 0.0–0.1)
Basophils Relative: 0.5 % (ref 0.0–3.0)
EOS ABS: 0.3 10*3/uL (ref 0.0–0.7)
EOS PCT: 6.4 % — AB (ref 0.0–5.0)
HCT: 43.2 % (ref 36.0–46.0)
HEMOGLOBIN: 14.2 g/dL (ref 12.0–15.0)
LYMPHS PCT: 29 % (ref 12.0–46.0)
Lymphs Abs: 1.2 10*3/uL (ref 0.7–4.0)
MCHC: 33 g/dL (ref 30.0–36.0)
MCV: 92.5 fl (ref 78.0–100.0)
MONO ABS: 0.5 10*3/uL (ref 0.1–1.0)
Monocytes Relative: 11.5 % (ref 3.0–12.0)
Neutro Abs: 2.2 10*3/uL (ref 1.4–7.7)
Neutrophils Relative %: 52.6 % (ref 43.0–77.0)
Platelets: 137 10*3/uL — ABNORMAL LOW (ref 150.0–400.0)
RBC: 4.67 Mil/uL (ref 3.87–5.11)
RDW: 12.4 % (ref 11.5–15.5)
WBC: 4.3 10*3/uL (ref 4.0–10.5)

## 2017-01-17 LAB — HEPATIC FUNCTION PANEL
ALK PHOS: 73 U/L (ref 39–117)
ALT: 12 U/L (ref 0–35)
AST: 18 U/L (ref 0–37)
Albumin: 3.8 g/dL (ref 3.5–5.2)
BILIRUBIN TOTAL: 0.5 mg/dL (ref 0.2–1.2)
Bilirubin, Direct: 0.1 mg/dL (ref 0.0–0.3)
Total Protein: 6.7 g/dL (ref 6.0–8.3)

## 2017-01-17 LAB — BASIC METABOLIC PANEL
BUN: 13 mg/dL (ref 6–23)
CO2: 33 mEq/L — ABNORMAL HIGH (ref 19–32)
Calcium: 9.5 mg/dL (ref 8.4–10.5)
Chloride: 106 mEq/L (ref 96–112)
Creatinine, Ser: 0.76 mg/dL (ref 0.40–1.20)
GFR: 77.72 mL/min (ref >60.00)
Glucose, Bld: 94 mg/dL (ref 70–99)
POTASSIUM: 4.7 meq/L (ref 3.5–5.1)
Sodium: 144 mEq/L (ref 135–145)

## 2017-01-17 LAB — TSH: TSH: 4.45 u[IU]/mL (ref 0.35–4.50)

## 2017-01-17 LAB — VITAMIN D 25 HYDROXY (VIT D DEFICIENCY, FRACTURES): VITD: 43.79 ng/mL (ref 30.00–100.00)

## 2017-01-17 NOTE — Progress Notes (Signed)
Subjective:    Patient ID: Jessica Ashley, female    DOB: [birth year], 82 y.o.   MRN: [id number]  HPI  Patient presents for yearly follow up exam. She is a pleasant and remarkable 82 year old female who  has a past medical history of Breast cancer ([place]) (2011); Cataract; Fuchs' corneal dystrophy; Osteopenia; Personal history of radiation therapy; Shingles; and Thyroid disease.  All immunizations and health maintenance protocols were reviewed with the patient and needed orders were placed.  Appropriate screening laboratory values were ordered for the patient including screening of hyperlipidemia, renal function and hepatic function.  Medication reconciliation,  past medical history, social history, problem list and allergies were reviewed in detail with the patient  Goals were established with regard to weight loss, exercise, and  diet in compliance with medications. She stays very active and eats healthy   End of life planning was discussed. She has a POA and living will.   She takes vitamin D and calcium for bone health. Her most recent bone density screen in July showed worsening osteopenia. She would like to discuss prolia.   She follows up with her eye doctor and uses [facility] for history of glaucoma. She is having her second cataract surgery tomorrow.   She has no acute complaints.   Review of Systems  Constitutional: Negative.   HENT: Negative.   Eyes: Negative.   Respiratory: Negative.   Cardiovascular: Negative.   Gastrointestinal: Negative.   Endocrine: Negative.   Genitourinary: Negative.   Musculoskeletal: Negative.   Skin: Negative.   Allergic/Immunologic: Negative.   Neurological: Negative.   Hematological: Negative.   Psychiatric/Behavioral: Negative.   All other systems reviewed and are negative.  Past Medical History:  Diagnosis Date  . Breast cancer ([place]) 2011  . Cataract   . Fuchs' corneal dystrophy   . Osteopenia   . Personal history of radiation  therapy   . Shingles   . Thyroid disease    thyroid surgery    Social History   Social History  . Marital status: Married    Spouse name: N/A  . Number of children: N/A  . Years of education: N/A   Occupational History  . Not on file.   Social History Main Topics  . Smoking status: Never Smoker  . Smokeless tobacco: Never Used  . Alcohol use No  . Drug use: No  . Sexual activity: No     Comment: married   Other Topics Concern  . Not on file   Social History Narrative   Retired    Married for 39 years    Two daughters ( both live locally)    Two granddaughters - both at [employer] to read and cross stitch. Likes to garden as well.     Past Surgical History:  Procedure Laterality Date  . BREAST BIOPSY Left 06/2015   benign stereotactic   . BREAST LUMPECTOMY  2011  . CHOLECYSTECTOMY  2011  . DILATION AND CURETTAGE, DIAGNOSTIC / THERAPEUTIC  1993   bladder ?  . TONSILLECTOMY  1953    Family History  Problem Relation Age of Onset  . Cancer Sister 66       bladder  . Pneumonia Mother   . Heart disease Mother     Allergies  Allergen Reactions  . Chlorhexidine Gluconate Swelling and Rash  . Sulfur Swelling    Face and possibly entire body if allowed without intervention.  Marland Kitchen Dextroamphetamine Sulfate  Per patient she is unaware of reaction to this medication.  . Sulfa Antibiotics Swelling  . Penicillins Rash    Where injected    Current Outpatient Prescriptions on File Prior to Visit  Medication Sig Dispense Refill  . aspirin 81 MG tablet Take 81 mg by mouth daily.      . Calcium Carbonate-Vitamin D (CALCIUM-VITAMIN D3 PO) Take 1 tablet by mouth daily.     . Cholecalciferol (VITAMIN D) 2000 UNITS CAPS Take by mouth.      . DiphenhydrAMINE HCl (BENADRYL PO) Take 1 tablet by mouth at bedtime as needed.    . Ibuprofen (MOTRIN PO) Take 1 tablet by mouth at bedtime as needed.    . timolol (TIMOPTIC) 0.5 % ophthalmic solution Place 1 drop into both  eyes daily.      No current facility-administered medications on file prior to visit.     There were no vitals taken for this visit.      Objective:   Physical Exam  Constitutional: She is oriented to person, place, and time. She appears well-developed and well-nourished. No distress.  HENT:  Head: Normocephalic and atraumatic.  Right Ear: External ear normal.  Left Ear: External ear normal.  Nose: Nose normal.  Mouth/Throat: Oropharynx is clear and moist. No oropharyngeal exudate.  Bone protrusion on hard palat.   Eyes: Pupils are equal, round, and reactive to light. Conjunctivae and EOM are normal. Right eye exhibits no discharge. Left eye exhibits no discharge. No scleral icterus.  Neck: Normal range of motion. Neck supple. No JVD present. No tracheal deviation present. No thyromegaly present.  Cardiovascular: Normal rate, regular rhythm, normal heart sounds and intact distal pulses.  Exam reveals no gallop and no friction rub.   No murmur heard. Pulmonary/Chest: Effort normal and breath sounds normal. No respiratory distress. She has no wheezes. She has no rales. She exhibits no tenderness.  Abdominal: Soft. Bowel sounds are normal. She exhibits no distension and no mass. There is no tenderness. There is no rebound and no guarding.  Musculoskeletal: Normal range of motion. She exhibits no edema, tenderness or deformity.  Lymphadenopathy:    She has no cervical adenopathy.  Neurological: She is alert and oriented to person, place, and time. She has normal reflexes. She displays normal reflexes. No cranial nerve deficit. She exhibits normal muscle tone. Coordination normal.  Skin: Skin is warm and dry. No rash noted. She is not diaphoretic. No erythema. No pallor.  Psychiatric: She has a normal mood and affect. Her behavior is normal. Judgment and thought content normal.  Nursing note and vitals reviewed.     Assessment & Plan:  1. Thyroid disease - Basic metabolic panel - CBC  with Differential/Platelet - TSH - Hepatic function panel  2. Osteopenia of multiple sites - We discussed prolia and she would like to talk it over with her family. I am fine with this as she is not osteoporotic. She is active and eats healthy. Also takes Calcium and Vitamin D supplements.  - Basic metabolic panel - Vitamin D, 25-hydroxy  3. Vitamin D deficiency  - Vitamin D, 25-hydroxy  Dorothyann Peng, NP

## 2017-01-17 NOTE — Patient Instructions (Signed)
It is always great seeing you today   I will follow up with you regarding your blood work   Like we discussed, it is recommended that you take Calcium 1200 mg daily. This can be in the form of a pill or food or combination of both. I do not think it is emergent that we start Prolia right now. You can research it and decide if this is something you would like to do.   Continue to stay active and eat healthy

## 2017-01-18 DIAGNOSIS — H25811 Combined forms of age-related cataract, right eye: Secondary | ICD-10-CM | POA: Diagnosis not present

## 2017-01-18 DIAGNOSIS — H2511 Age-related nuclear cataract, right eye: Secondary | ICD-10-CM | POA: Diagnosis not present

## 2017-03-30 ENCOUNTER — Ambulatory Visit (INDEPENDENT_AMBULATORY_CARE_PROVIDER_SITE_OTHER): Payer: Medicare Other

## 2017-03-30 DIAGNOSIS — Z23 Encounter for immunization: Secondary | ICD-10-CM | POA: Diagnosis not present

## 2017-06-04 DIAGNOSIS — S0181XA Laceration without foreign body of other part of head, initial encounter: Secondary | ICD-10-CM | POA: Diagnosis not present

## 2017-06-07 DIAGNOSIS — S0181XD Laceration without foreign body of other part of head, subsequent encounter: Secondary | ICD-10-CM | POA: Diagnosis not present

## 2017-07-05 ENCOUNTER — Ambulatory Visit
Admission: RE | Admit: 2017-07-05 | Discharge: 2017-07-05 | Disposition: A | Discharge status: Home / Self Care | Payer: Medicare Other | Source: Ambulatory Visit | Attending: Adult Health | Admitting: Adult Health

## 2017-07-05 DIAGNOSIS — R928 Other abnormal and inconclusive findings on diagnostic imaging of breast: Secondary | ICD-10-CM | POA: Diagnosis not present

## 2017-07-05 DIAGNOSIS — C50912 Malignant neoplasm of unspecified site of left female breast: Secondary | ICD-10-CM

## 2017-07-05 DIAGNOSIS — Z170 Estrogen receptor positive status [ER+]: Principal | ICD-10-CM

## 2017-07-27 DIAGNOSIS — H1851 Endothelial corneal dystrophy: Secondary | ICD-10-CM | POA: Diagnosis not present

## 2017-07-27 DIAGNOSIS — H40053 Ocular hypertension, bilateral: Secondary | ICD-10-CM | POA: Diagnosis not present

## 2017-07-27 DIAGNOSIS — Z961 Presence of intraocular lens: Secondary | ICD-10-CM | POA: Diagnosis not present

## 2017-07-27 DIAGNOSIS — H353112 Nonexudative age-related macular degeneration, right eye, intermediate dry stage: Secondary | ICD-10-CM | POA: Diagnosis not present

## 2017-11-01 ENCOUNTER — Inpatient Hospital Stay: Payer: Medicare Other | Attending: Adult Health | Admitting: Adult Health

## 2017-11-09 ENCOUNTER — Ambulatory Visit: Payer: Medicare Other

## 2017-12-11 DIAGNOSIS — H40013 Open angle with borderline findings, low risk, bilateral: Secondary | ICD-10-CM | POA: Diagnosis not present

## 2017-12-11 DIAGNOSIS — H40053 Ocular hypertension, bilateral: Secondary | ICD-10-CM | POA: Diagnosis not present

## 2018-01-10 ENCOUNTER — Ambulatory Visit (INDEPENDENT_AMBULATORY_CARE_PROVIDER_SITE_OTHER): Payer: Medicare Other

## 2018-01-10 ENCOUNTER — Ambulatory Visit (INDEPENDENT_AMBULATORY_CARE_PROVIDER_SITE_OTHER): Payer: Medicare Other | Admitting: Family Medicine

## 2018-01-10 ENCOUNTER — Encounter: Payer: Self-pay | Admitting: Family Medicine

## 2018-01-10 VITALS — BP 110/74 | Temp 98.5°F | Wt 120.0 lb

## 2018-01-10 DIAGNOSIS — M25571 Pain in right ankle and joints of right foot: Secondary | ICD-10-CM

## 2018-01-10 DIAGNOSIS — L03115 Cellulitis of right lower limb: Secondary | ICD-10-CM

## 2018-01-10 MED ORDER — DOXYCYCLINE HYCLATE 100 MG PO TABS: 100.0000 mg | ORAL_TABLET | Freq: Two times a day (BID) | ORAL | 0 refills | Status: AC

## 2018-01-10 NOTE — Progress Notes (Signed)
Jessica Ashley DOB: [birth year] Encounter date: 01/10/2018  This is a 83 y.o. female who presents with Chief Complaint  Patient presents with  . Rt Foot/Ankle Pain and Bruising    Pt Fell on 01/01/18    History of present illness:  Getting up out of chair; just fell standing up; foot just didn't work. Twisted ankle; fell onto carpet. 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 No LOC. Hurt right away. Has hurt ankle but long ago as child.   Has been trying to keep elevated. Still using ankle. Soaking in epson salt, compresses, ice. Gets stiff and hurts after being on it, but also hurts when she is up on it. Has changed how she walks to help with pain during walking.   Takes occasional motrin; helps get through night.   Swelling and bruising have improved. Pain is 5/10 with walking. This has been consistent in last few days. Has not felt entirely well in last few days. Just somewhat achy. No fevers.   Has been getting some foot cramps at night. Going on for last few months. Mild.     Allergies  Allergen Reactions  . Chlorhexidine Gluconate Swelling and Rash  . Sulfur Swelling    Face and possibly entire body if allowed without intervention.  Marland Kitchen Dextroamphetamine Sulfate     Per patient she is unaware of reaction to this medication.  . Sulfa Antibiotics Swelling  . Penicillins Rash    Where injected   Current Meds  Medication Sig  . aspirin 81 MG tablet Take 81 mg by mouth daily.    . Calcium Carb-Cholecalciferol [phone] MG-UNIT TABS Take 1 tablet by mouth daily.  . Cholecalciferol (VITAMIN D) 2000 UNITS CAPS Take by mouth.    . [employer]S SODIUM CHLORIDE 5 % ophthalmic solution INSTILL 1 DROP INTO BOTH EYES 4 TIMES A DAY  . DiphenhydrAMINE HCl (BENADRYL PO) Take 1 tablet by mouth at bedtime as needed.  . DUREZOL 0.05 % EMUL   . Ibuprofen (MOTRIN PO) Take 1 tablet by mouth at bedtime as needed.  . moxifloxacin (VIGAMOX) 0.5 % ophthalmic solution     Review of Systems   Constitutional: Negative for chills and fever.  HENT: Negative for congestion, sinus pressure and sinus pain.   Respiratory: Negative for cough, chest tightness, shortness of breath and wheezing.   Cardiovascular: Positive for leg swelling (RLE secondary to injury). Negative for chest pain.  Musculoskeletal: Positive for arthralgias, gait problem (see hpi) and joint swelling.  Skin: Positive for color change. Negative for wound.  Psychiatric/Behavioral: Positive for sleep disturbance (some pain during sleep; better with motrin).    Objective:  BP 110/74   Temp 98.5 F (36.9 C) (Oral)   Wt 120 lb (54.4 kg)   BMI 24.24 kg/m   Weight: 120 lb (54.4 kg)   BP Readings from Last 3 Encounters:  01/10/18 110/74  01/17/17 110/64  11/08/16 102/62   Wt Readings from Last 3 Encounters:  01/10/18 120 lb (54.4 kg)  01/17/17 124 lb (56.2 kg)  11/08/16 124 lb (56.2 kg)    Physical Exam  Constitutional: She appears well-developed and well-nourished. No distress.  Cardiovascular: Normal rate, regular rhythm, normal heart sounds and intact distal pulses. Exam reveals no friction rub.  No murmur heard. No edema LLE   Pulmonary/Chest: Effort normal and breath sounds normal. No respiratory distress. She has no wheezes. She has no rales.  Musculoskeletal:       Right ankle: She exhibits decreased range  of motion, swelling and ecchymosis. She exhibits normal pulse. Tenderness. Lateral malleolus tenderness found. No medial malleolus and no head of 5th metatarsal tenderness found.  Ligamentous exam is difficult due to diffuse edema.   There is more localized edema and tenderness lateral malleolus   Skin: Bruising and ecchymosis noted.  Right ankle has bruising 1/3 way up extremity. There is deeper blue/green ecchymosis base of right foot and then yellow-green ecchymosis up leg.   Just below lateral malleolus there is a more localized appearing erythema with single small vesicle on anterior aspect.  This area is warm to touch.  Psychiatric: She has a normal mood and affect.    Assessment/Plan  1. Acute right ankle pain Due to continued pain, esp around lateral malleolus and severity of edema/bruising will get xr to r/o fracture.  - DG Ankle Complete Right; Future  2. Cellulitis of right ankle ?secondary to prolonged edema. Doxy selected due to allergies. Monitor redness regularly. Let us know if not resolved after abx or if any worsening of redness, swelling, or pain.  - doxycycline (VIBRA-TABS) 100 MG tablet; Take 1 tablet (100 mg total) by mouth 2 (two) times daily for 7 days.  Dispense: 14 tablet; Refill: 0     I will call you with xray results when I get them. I will let you know about any additional management that is needed at that time.   Start the doxycycline and take as directed. Let me know if any problems with medication. Let us know if any redness remains in ankle after completing medication.   Keep leg elevated above heart level when sitting/lying.   Let us know if any worsening of swelling or pain.   (has f/u in 2 weeks)  Micheline Rough, MD

## 2018-01-10 NOTE — Patient Instructions (Addendum)
I will call you with xray results when I get them. I will let you know about any additional management that is needed at that time.   Start the doxycycline and take as directed. Let me know if any problems with medication. Let us know if any redness remains in ankle after completing medication.   Keep leg elevated above heart level when sitting/lying.   Let us know if any worsening of swelling or pain.

## 2018-01-14 ENCOUNTER — Ambulatory Visit (INDEPENDENT_AMBULATORY_CARE_PROVIDER_SITE_OTHER): Payer: Medicare Other | Admitting: Family Medicine

## 2018-01-14 ENCOUNTER — Encounter (INDEPENDENT_AMBULATORY_CARE_PROVIDER_SITE_OTHER): Payer: Self-pay | Admitting: Family Medicine

## 2018-01-14 DIAGNOSIS — M25571 Pain in right ankle and joints of right foot: Secondary | ICD-10-CM | POA: Diagnosis not present

## 2018-01-14 DIAGNOSIS — S8265XA Nondisplaced fracture of lateral malleolus of left fibula, initial encounter for closed fracture: Secondary | ICD-10-CM

## 2018-01-14 NOTE — Progress Notes (Signed)
Office Visit Note   Patient: Jessica Ashley           Date of Birth: 1935-05-12           MRN: [id number] Visit Date: 01/14/2018 Requested by: Dorothyann Peng, NP [address] [place] [place], [place] [address] PCP: Dorothyann Peng, NP  Subjective: Chief Complaint  Patient presents with  . Right Ankle - Pain    DOI 01/01/18 - Stood up from seated position quickly, and fell  - "couldn't feel my foot."    HPI: She is seen at the request of Dorothyann Peng for right ankle fracture.  On August 6 she was sitting in her chair.  She stood up and could not feel her ankle.  She turned and her ankle twisted causing her to fall.  She was unable to get up on her own, her husband helped her.  Her ankle hurt and was swollen and bruised, but she thought it would get better with time.  After a few days she was able to bear weight.  She has continued to have pain when something touches the lateral aspect of her ankle.  She went to her PCP a few days ago and x-rays were obtained showing a nondisplaced avulsion fracture of the distal fibula.  She now presents for evaluation.  No previous problems with her ankle.  She takes calcium and vitamin D, she is a non-smoker.  She is otherwise in very good health.              ROS: Otherwise negative.  Objective: Vital Signs: There were no vitals taken for this visit.  Physical Exam:  Right ankle: No tenderness at the proximal fibula, negative syndesmosis squeeze.  Slight tenderness at the medial malleolus, moderate tenderness over the lateral malleolus.  There is 2+ lateral swelling and bruising.  She is able to dorsiflex and evert her ankle against resistance with good strength.  No numbness to light touch.  Imaging: X-rays from August 16 were reviewed showing nondisplaced avulsion type fracture of the distal fibula.  Assessment & Plan: 1.  Right ankle pain 2 weeks status post fall due to distal fibula avulsion fracture, nondisplaced. -ASO brace for support during  activity.  Follow-up in 3 weeks for 2 view ankle x-rays to monitor progress.  Anticipate 6 to 12 weeks total healing time.   Follow-Up Instructions: Return in about 3 weeks (around 02/04/2018).     Procedures: None today   PMFS History: Patient Active Problem List   Diagnosis Date Noted  . Thyroid disease 01/13/2016  . Shingles outbreak 10/20/2014  . Shingles 10/20/2014  . Routine general medical examination at a health care facility 10/13/2014  . Dysplastic nevus of trunk 09/25/2012  . Glaucoma 04/15/2012  . Osteopenia 04/15/2012  . Breast cancer, left breast ([place]) 08/16/2010   Past Medical History:  Diagnosis Date  . Breast cancer ([place]) 2011  . Cataract   . Fuchs' corneal dystrophy   . Osteopenia   . Personal history of radiation therapy   . Shingles   . Thyroid disease    thyroid surgery    Family History  Problem Relation Age of Onset  . Cancer Sister 20       bladder  . Pneumonia Mother   . Heart disease Mother     Past Surgical History:  Procedure Laterality Date  . BREAST BIOPSY Left 06/2015   benign stereotactic   . BREAST LUMPECTOMY  2011  . CHOLECYSTECTOMY  2011  .  DILATION AND CURETTAGE, DIAGNOSTIC / THERAPEUTIC  1993   bladder ?  . TONSILLECTOMY  1953   Social History   Occupational History  . Not on file  Tobacco Use  . Smoking status: Never Smoker  . Smokeless tobacco: Never Used  Substance and Sexual Activity  . Alcohol use: No    Alcohol/week: 0.0 standard drinks  . Drug use: No  . Sexual activity: Never    Partners: Male    Comment: married

## 2018-01-22 ENCOUNTER — Encounter: Payer: Self-pay | Admitting: Adult Health

## 2018-01-22 ENCOUNTER — Ambulatory Visit (INDEPENDENT_AMBULATORY_CARE_PROVIDER_SITE_OTHER): Payer: Medicare Other | Admitting: Adult Health

## 2018-01-22 VITALS — BP 104/66 | Temp 98.5°F | Ht 58.75 in | Wt 119.0 lb

## 2018-01-22 DIAGNOSIS — L03115 Cellulitis of right lower limb: Secondary | ICD-10-CM

## 2018-01-22 DIAGNOSIS — E079 Disorder of thyroid, unspecified: Secondary | ICD-10-CM | POA: Diagnosis not present

## 2018-01-22 DIAGNOSIS — E559 Vitamin D deficiency, unspecified: Secondary | ICD-10-CM

## 2018-01-22 DIAGNOSIS — M8589 Other specified disorders of bone density and structure, multiple sites: Secondary | ICD-10-CM | POA: Diagnosis not present

## 2018-01-22 LAB — CBC WITH DIFFERENTIAL/PLATELET
BASOS ABS: 0 10*3/uL (ref 0.0–0.1)
Basophils Relative: 0.4 % (ref 0.0–3.0)
Eosinophils Absolute: 0.4 10*3/uL (ref 0.0–0.7)
Eosinophils Relative: 8.5 % — ABNORMAL HIGH (ref 0.0–5.0)
HCT: 42.8 % (ref 36.0–46.0)
Hemoglobin: 14.3 g/dL (ref 12.0–15.0)
Lymphocytes Relative: 26.8 % (ref 12.0–46.0)
Lymphs Abs: 1.4 10*3/uL (ref 0.7–4.0)
MCHC: 33.4 g/dL (ref 30.0–36.0)
MCV: 90.3 fl (ref 78.0–100.0)
MONO ABS: 0.7 10*3/uL (ref 0.1–1.0)
MONOS PCT: 13.2 % — AB (ref 3.0–12.0)
NEUTROS PCT: 51.1 % (ref 43.0–77.0)
Neutro Abs: 2.7 10*3/uL (ref 1.4–7.7)
Platelets: 122 10*3/uL — ABNORMAL LOW (ref 150.0–400.0)
RBC: 4.74 Mil/uL (ref 3.87–5.11)
RDW: 12.5 % (ref 11.5–15.5)
WBC: 5.3 10*3/uL (ref 4.0–10.5)

## 2018-01-22 LAB — HEPATIC FUNCTION PANEL
ALK PHOS: 83 U/L (ref 39–117)
ALT: 10 U/L (ref 0–35)
AST: 18 U/L (ref 0–37)
Albumin: 4 g/dL (ref 3.5–5.2)
BILIRUBIN DIRECT: 0.1 mg/dL (ref 0.0–0.3)
BILIRUBIN TOTAL: 0.5 mg/dL (ref 0.2–1.2)
Total Protein: 6.9 g/dL (ref 6.0–8.3)

## 2018-01-22 LAB — BASIC METABOLIC PANEL
BUN: 19 mg/dL (ref 6–23)
CALCIUM: 9.7 mg/dL (ref 8.4–10.5)
CO2: 33 mEq/L — ABNORMAL HIGH (ref 19–32)
Chloride: 103 mEq/L (ref 96–112)
Creatinine, Ser: 0.84 mg/dL (ref 0.40–1.20)
GFR: 69.06 mL/min (ref >60.00)
GLUCOSE: 88 mg/dL (ref 70–99)
Potassium: 4.4 mEq/L (ref 3.5–5.1)
SODIUM: 141 meq/L (ref 135–145)

## 2018-01-22 LAB — VITAMIN D 25 HYDROXY (VIT D DEFICIENCY, FRACTURES): VITD: 56.51 ng/mL (ref 30.00–100.00)

## 2018-01-22 LAB — TSH: TSH: 4.38 u[IU]/mL (ref 0.35–4.50)

## 2018-01-22 MED ORDER — DOXYCYCLINE HYCLATE 100 MG PO CAPS: 100.0000 mg | ORAL_CAPSULE | Freq: Two times a day (BID) | ORAL | 0 refills | Status: DC

## 2018-01-22 NOTE — Progress Notes (Signed)
Subjective:    Patient ID: Debroah Loop, female    DOB: September 03, 1934, 83 y.o.   MRN: [id number]  HPI Patient presents for yearly follow up exam. She is a pleasant 83 year old female who  has a past medical history of Breast cancer ([place]) (2011), Cataract, Fuchs' corneal dystrophy, Osteopenia, Personal history of radiation therapy, Shingles, and Thyroid disease.  Osteopenia - last bone density 11/2016. Showed osteopenia. Currently taking OTC Calcium and Vitamin D supplements.   All immunizations and health maintenance protocols were reviewed with the patient and needed orders were placed.  Appropriate screening laboratory values were ordered for the patient including screening of hyperlipidemia, renal function and hepatic function.  Medication reconciliation,  past medical history, social history, problem list and allergies were reviewed in detail with the patient  Goals were established with regard to weight loss, exercise, and  diet in compliance with medications  End of life planning was discussed. She has an advanced directive and living will.   She is up-to-date on routine mammograms( due to history of breast cancer), dental screens and vision screens.  Interval history - was seen by another provider after mechanical fall. She was sent to orthopedics after xray showed distal fibula avulsion fracture, nondisplaced. She has been placed in a boot and follow up in 3 weeks. During this visit with Primary Care she was also placed on 7 day course of Doxycycline for suspected cellulitis of right ankle. She reports that she noticed improvement in the redness and warmth but does not feel as though her symptoms resolved.   Review of Systems  Constitutional: Negative.   HENT: Negative.   Eyes: Negative.   Respiratory: Negative.   Cardiovascular: Negative.   Gastrointestinal: Negative.   Endocrine: Negative.   Genitourinary: Negative.   Musculoskeletal: Positive for arthralgias and joint  swelling.  Skin: Positive for color change and rash.  Allergic/Immunologic: Negative.   Neurological: Negative.   Hematological: Negative.   Psychiatric/Behavioral: Negative.    Past Medical History:  Diagnosis Date  . Breast cancer ([place]) 2011  . Cataract   . Fuchs' corneal dystrophy   . Osteopenia   . Personal history of radiation therapy   . Shingles   . Thyroid disease    thyroid surgery    Social History   Socioeconomic History  . Marital status: Married    Spouse name: Not on file  . Number of children: Not on file  . Years of education: Not on file  . Highest education level: Not on file  Occupational History  . Not on file  Social Needs  . Financial resource strain: Not on file  . Food insecurity:    Worry: Not on file    Inability: Not on file  . Transportation needs:    Medical: Not on file    Non-medical: Not on file  Tobacco Use  . Smoking status: Never Smoker  . Smokeless tobacco: Never Used  Substance and Sexual Activity  . Alcohol use: No    Alcohol/week: 0.0 standard drinks  . Drug use: No  . Sexual activity: Never    Partners: Male    Comment: married  Lifestyle  . Physical activity:    Days per week: Not on file    Minutes per session: Not on file  . Stress: Not on file  Relationships  . Social connections:    Talks on phone: Not on file    Gets together: Not on file    Attends religious  service: Not on file    Active member of club or organization: Not on file    Attends meetings of clubs or organizations: Not on file    Relationship status: Not on file  . Intimate partner violence:    Fear of current or ex partner: Not on file    Emotionally abused: Not on file    Physically abused: Not on file    Forced sexual activity: Not on file  Other Topics Concern  . Not on file  Social History Narrative   Retired    Married for 45 years    Two daughters ( both live locally)    Two granddaughters - both at [employer] to read and  cross stitch. Likes to garden as well.     Past Surgical History:  Procedure Laterality Date  . BREAST BIOPSY Left 06/2015   benign stereotactic   . BREAST LUMPECTOMY  2011  . CHOLECYSTECTOMY  2011  . DILATION AND CURETTAGE, DIAGNOSTIC / THERAPEUTIC  1993   bladder ?  . TONSILLECTOMY  1953    Family History  Problem Relation Age of Onset  . Cancer Sister 56       bladder  . Pneumonia Mother   . Heart disease Mother     Allergies  Allergen Reactions  . Chlorhexidine Gluconate Swelling and Rash  . Sulfur Swelling    Face and possibly entire body if allowed without intervention.  Marland Kitchen Dextroamphetamine Sulfate     Per patient she is unaware of reaction to this medication.  . Sulfa Antibiotics Swelling  . Penicillins Rash    Where injected    Current Outpatient Medications on File Prior to Visit  Medication Sig Dispense Refill  . aspirin 81 MG tablet Take 81 mg by mouth daily.      . Calcium Carb-Cholecalciferol [phone] MG-UNIT TABS Take 1 tablet by mouth daily.    . Cholecalciferol (VITAMIN D) 2000 UNITS CAPS Take by mouth.      . CVS SODIUM CHLORIDE 5 % ophthalmic solution INSTILL 1 DROP INTO BOTH EYES 4 TIMES A DAY  99  . DiphenhydrAMINE HCl (BENADRYL PO) Take 1 tablet by mouth at bedtime as needed.    . DUREZOL 0.05 % EMUL   0  . Ibuprofen (MOTRIN PO) Take 1 tablet by mouth at bedtime as needed.     No current facility-administered medications on file prior to visit.     BP 104/66   Temp 98.5 F (36.9 C) (Oral)   Ht 4' 10.75" (1.492 m)   Wt 119 lb (54 kg)   BMI 24.24 kg/m       Objective:   Physical Exam  Constitutional: She is oriented to person, place, and time. She appears well-developed and well-nourished. No distress.  HENT:  Head: Normocephalic and atraumatic.  Right Ear: External ear normal.  Left Ear: External ear normal.  Nose: Nose normal.  Mouth/Throat: Oropharynx is clear and moist. No oropharyngeal exudate.  Eyes: Pupils are equal, round,  and reactive to light. Conjunctivae and EOM are normal. Right eye exhibits no discharge. Left eye exhibits no discharge. No scleral icterus.  Neck: Normal range of motion. Neck supple. No JVD present. No tracheal deviation present. No thyromegaly present.  Cardiovascular: Normal rate, regular rhythm, normal heart sounds and intact distal pulses. Exam reveals no gallop and no friction rub.  No murmur heard. Pulmonary/Chest: Effort normal and breath sounds normal. No stridor. No respiratory distress. She  has no wheezes. She has no rales. She exhibits no tenderness.  Abdominal: Soft. Bowel sounds are normal. She exhibits no distension and no mass. There is no tenderness. There is no rebound and no guarding. No hernia.  Musculoskeletal: Normal range of motion. She exhibits no edema, tenderness or deformity.  Lymphadenopathy:    She has no cervical adenopathy.  Neurological: She is alert and oriented to person, place, and time. She displays normal reflexes. No cranial nerve deficit or sensory deficit. She exhibits normal muscle tone. Coordination normal.  Skin: Skin is warm and dry. Capillary refill takes less than 2 seconds. No rash noted. She is not diaphoretic. There is erythema. No pallor.  Continues to have some swelling, redness and warmth around lateral aspect of right ankle.   Psychiatric: She has a normal mood and affect. Her behavior is normal. Judgment and thought content normal.  Nursing note and vitals reviewed.     Assessment & Plan:  1. Thyroid disease - consider medication therapy.  - Basic metabolic panel - CBC with Differential/Platelet - Hepatic function panel - TSH  2. Osteopenia of multiple sites  - Basic metabolic panel - CBC with Differential/Platelet - Hepatic function panel - TSH - Vitamin D, 25-hydroxy  3. Cellulitis of right ankle  - doxycycline (VIBRAMYCIN) 100 MG capsule; Take 1 capsule (100 mg total) by mouth 2 (two) times daily.  Dispense: 20 capsule;  Refill: 0 - Follow up if not resolved   4. Vitamin D deficiency  - Vitamin D, 25-hydroxy   Dorothyann Peng, NP

## 2018-02-04 ENCOUNTER — Encounter (INDEPENDENT_AMBULATORY_CARE_PROVIDER_SITE_OTHER): Payer: Self-pay | Admitting: Family Medicine

## 2018-02-04 ENCOUNTER — Ambulatory Visit (INDEPENDENT_AMBULATORY_CARE_PROVIDER_SITE_OTHER): Payer: Medicare Other | Admitting: Family Medicine

## 2018-02-04 ENCOUNTER — Ambulatory Visit (INDEPENDENT_AMBULATORY_CARE_PROVIDER_SITE_OTHER): Payer: Medicare Other

## 2018-02-04 DIAGNOSIS — S82891S Other fracture of right lower leg, sequela: Secondary | ICD-10-CM | POA: Diagnosis not present

## 2018-02-04 NOTE — Progress Notes (Signed)
   Office Visit Note   Patient: Jessica Ashley           Date of Birth: [birth year]           MRN: [id number] Visit Date: 02/04/2018 Requested by: Dorothyann Peng, NP [address], [place] [id number] PCP: Dorothyann Peng, NP  Subjective: Chief Complaint  Patient presents with  . Right Ankle - Follow-up, Fracture    "getting better with each day" - FWB with an ACE wrap on ankle    HPI: She is now about a month status post right ankle sprain with avulsion fracture lateral malleolus.  Pain steadily improving.  Still having some swelling but slowly getting better.              ROS: Noncontributory  Objective: Vital Signs: There were no vitals taken for this visit.  Physical Exam:  Right ankle: Residual edema over the lateral ankle with no warmth or erythema.  There is some peeling of the skin.  Mild tenderness at the tip of the lateral malleolus.  Ligaments feel stable.  Imaging: 2 view ankle x-rays: The avulsion fracture is no longer readily visible today.  Ankle alignment is anatomic.  Assessment & Plan: 1.  Clinically healing 1 month status post right ankle sprain with lateral malleolus avulsion fracture -Start doing rubber band exercises and one foot balancing.  Anticipate 3-4 more weeks healing time.  Physical therapy if not improving.  Follow-up PRN.   Follow-Up Instructions: Return in about 4 weeks (around 03/04/2018), or if symptoms worsen or fail to improve.     Procedures: None today.   PMFS History: Patient Active Problem List   Diagnosis Date Noted  . Thyroid disease 01/13/2016  . Dysplastic nevus of trunk 09/25/2012  . Glaucoma 04/15/2012  . Osteopenia 04/15/2012  . Breast cancer, left breast ([place]) 08/16/2010   Past Medical History:  Diagnosis Date  . Breast cancer ([place]) 2011  . Cataract   . Fuchs' corneal dystrophy   . Osteopenia   . Personal history of radiation therapy   . Shingles   . Thyroid disease    thyroid surgery    Family  History  Problem Relation Age of Onset  . Cancer Sister 31       bladder  . Pneumonia Mother   . Heart disease Mother     Past Surgical History:  Procedure Laterality Date  . BREAST BIOPSY Left 06/2015   benign stereotactic   . BREAST LUMPECTOMY  2011  . CHOLECYSTECTOMY  2011  . DILATION AND CURETTAGE, DIAGNOSTIC / THERAPEUTIC  1993   bladder ?  . TONSILLECTOMY  1953   Social History   Occupational History  . Not on file  Tobacco Use  . Smoking status: Never Smoker  . Smokeless tobacco: Never Used  Substance and Sexual Activity  . Alcohol use: No    Alcohol/week: 0.0 standard drinks  . Drug use: No  . Sexual activity: Never    Partners: Male    Comment: married

## 2018-02-28 ENCOUNTER — Ambulatory Visit (INDEPENDENT_AMBULATORY_CARE_PROVIDER_SITE_OTHER): Payer: Medicare Other | Admitting: *Deleted

## 2018-02-28 DIAGNOSIS — Z23 Encounter for immunization: Secondary | ICD-10-CM | POA: Diagnosis not present

## 2018-07-30 DIAGNOSIS — H353111 Nonexudative age-related macular degeneration, right eye, early dry stage: Secondary | ICD-10-CM | POA: Diagnosis not present

## 2018-07-30 DIAGNOSIS — Z961 Presence of intraocular lens: Secondary | ICD-10-CM | POA: Diagnosis not present

## 2018-07-30 DIAGNOSIS — H1851 Endothelial corneal dystrophy: Secondary | ICD-10-CM | POA: Diagnosis not present

## 2018-07-30 DIAGNOSIS — H52203 Unspecified astigmatism, bilateral: Secondary | ICD-10-CM | POA: Diagnosis not present

## 2019-03-06 ENCOUNTER — Ambulatory Visit (INDEPENDENT_AMBULATORY_CARE_PROVIDER_SITE_OTHER): Payer: Medicare Other

## 2019-03-06 ENCOUNTER — Other Ambulatory Visit: Payer: Self-pay

## 2019-03-06 DIAGNOSIS — Z23 Encounter for immunization: Secondary | ICD-10-CM

## 2019-03-28 ENCOUNTER — Encounter: Payer: Medicare Other | Admitting: Adult Health

## 2019-05-15 ENCOUNTER — Other Ambulatory Visit: Payer: Self-pay

## 2019-05-16 ENCOUNTER — Encounter: Payer: Self-pay | Admitting: Adult Health

## 2019-05-16 ENCOUNTER — Ambulatory Visit (INDEPENDENT_AMBULATORY_CARE_PROVIDER_SITE_OTHER): Payer: Medicare Other | Admitting: Adult Health

## 2019-05-16 VITALS — BP 126/70 | Temp 97.4°F | Ht 59.0 in | Wt 113.0 lb

## 2019-05-16 DIAGNOSIS — M8589 Other specified disorders of bone density and structure, multiple sites: Secondary | ICD-10-CM | POA: Diagnosis not present

## 2019-05-16 DIAGNOSIS — Z170 Estrogen receptor positive status [ER+]: Secondary | ICD-10-CM

## 2019-05-16 DIAGNOSIS — C50912 Malignant neoplasm of unspecified site of left female breast: Secondary | ICD-10-CM | POA: Diagnosis not present

## 2019-05-16 DIAGNOSIS — E559 Vitamin D deficiency, unspecified: Secondary | ICD-10-CM | POA: Diagnosis not present

## 2019-05-16 DIAGNOSIS — E079 Disorder of thyroid, unspecified: Secondary | ICD-10-CM | POA: Diagnosis not present

## 2019-05-16 LAB — TSH: TSH: 2.27 u[IU]/mL (ref 0.35–4.50)

## 2019-05-16 LAB — CBC WITH DIFFERENTIAL/PLATELET
Basophils Absolute: 0 10*3/uL (ref 0.0–0.1)
Basophils Relative: 0.4 % (ref 0.0–3.0)
Eosinophils Absolute: 0.1 10*3/uL (ref 0.0–0.7)
Eosinophils Relative: 2.9 % (ref 0.0–5.0)
HCT: 43.8 % (ref 36.0–46.0)
Hemoglobin: 14.7 g/dL (ref 12.0–15.0)
Lymphocytes Relative: 23.6 % (ref 12.0–46.0)
Lymphs Abs: 1.1 10*3/uL (ref 0.7–4.0)
MCHC: 33.6 g/dL (ref 30.0–36.0)
MCV: 92.1 fl (ref 78.0–100.0)
Monocytes Absolute: 0.4 10*3/uL (ref 0.1–1.0)
Monocytes Relative: 8.7 % (ref 3.0–12.0)
Neutro Abs: 2.9 10*3/uL (ref 1.4–7.7)
Neutrophils Relative %: 64.4 % (ref 43.0–77.0)
Platelets: 120 10*3/uL — ABNORMAL LOW (ref 150.0–400.0)
RBC: 4.75 Mil/uL (ref 3.87–5.11)
RDW: 12.4 % (ref 11.5–15.5)
WBC: 4.5 10*3/uL (ref 4.0–10.5)

## 2019-05-16 LAB — COMPREHENSIVE METABOLIC PANEL
ALT: 11 U/L (ref 0–35)
AST: 17 U/L (ref 0–37)
Albumin: 4.2 g/dL (ref 3.5–5.2)
Alkaline Phosphatase: 77 U/L (ref 39–117)
BUN: 11 mg/dL (ref 6–23)
CO2: 32 mEq/L (ref 19–32)
Calcium: 9.4 mg/dL (ref 8.4–10.5)
Chloride: 104 mEq/L (ref 96–112)
Creatinine, Ser: 0.7 mg/dL (ref 0.40–1.20)
GFR: 79.94 mL/min (ref >60.00)
Glucose, Bld: 93 mg/dL (ref 70–99)
Potassium: 4.8 mEq/L (ref 3.5–5.1)
Sodium: 142 mEq/L (ref 135–145)
Total Bilirubin: 0.7 mg/dL (ref 0.2–1.2)
Total Protein: 6.9 g/dL (ref 6.0–8.3)

## 2019-05-16 LAB — VITAMIN D 25 HYDROXY (VIT D DEFICIENCY, FRACTURES): VITD: 41.87 ng/mL (ref 30.00–100.00)

## 2019-05-16 NOTE — Progress Notes (Signed)
Subjective:    Patient ID: Jessica Ashley, female    DOB: 1934-12-16, 84 y.o.   MRN: [id number]  HPI Patient presents for yearly preventative medicine examination. She is a pleasant 84 year old female who  has a past medical history of Breast cancer ([facility]) (2011), Cataract, Fuchs' corneal dystrophy, Osteopenia, Personal history of radiation therapy, Shingles, and Thyroid disease.   Osteopenia -her last bone density was in July 2018.  This showed osteopenia.  She is currently taking over-the-counter calcium and vitamin D supplements  Macular Degeneration - is followed by ophthalmology   History of thyroid disease - does not take medication.   History of Breast Cancer - is no longer being seen by oncology.   All immunizations and health maintenance protocols were reviewed with the patient and needed orders were placed. UTD on vaccinations   Appropriate screening laboratory values were ordered for the patient including screening of hyperlipidemia, renal function and hepatic function.  Medication reconciliation,  past medical history, social history, problem list and allergies were reviewed in detail with the patient  Goals were established with regard to weight loss, exercise, and  diet in compliance with medications. She has been trying to eat healthy but has not been exercising due to COVID restrictions. She is not leaving the house, except for essential issues.   Wt Readings from Last 3 Encounters:  05/16/19 113 lb (51.3 kg)  01/22/18 119 lb (54 kg)  01/10/18 120 lb (54.4 kg)   End of life planning was discussed. She has an advanced directive and living will.   She has no acute complaints today    Review of Systems  Constitutional: Negative.   HENT: Negative.   Eyes: Negative.   Respiratory: Negative.   Cardiovascular: Negative.   Gastrointestinal: Negative.   Endocrine: Negative.   Genitourinary: Negative.   Musculoskeletal: Positive for arthralgias.  Skin: Negative.     Allergic/Immunologic: Negative.   Neurological: Negative.   Hematological: Negative.   Psychiatric/Behavioral: Negative.    Past Medical History:  Diagnosis Date  . Breast cancer ([facility]) 2011  . Cataract   . Fuchs' corneal dystrophy   . Osteopenia   . Personal history of radiation therapy   . Shingles   . Thyroid disease    thyroid surgery    Social History   Socioeconomic History  . Marital status: Married    Spouse name: Not on file  . Number of children: Not on file  . Years of education: Not on file  . Highest education level: Not on file  Occupational History  . Not on file  Tobacco Use  . Smoking status: Never Smoker  . Smokeless tobacco: Never Used  Substance and Sexual Activity  . Alcohol use: No    Alcohol/week: 0.0 standard drinks  . Drug use: No  . Sexual activity: Never    Partners: Male    Comment: married  Other Topics Concern  . Not on file  Social History Narrative   Retired    Married for 49 years    Two daughters ( both live locally)    Two granddaughters - both at [employer] to read and cross stitch. Likes to garden as well.    Social Determinants of Health   Financial Resource Strain:   . Difficulty of Paying Living Expenses: Not on file  Food Insecurity:   . Worried About Charity fundraiser in the Last Year: Not on file  .  Ran Out of Food in the Last Year: Not on file  Transportation Needs:   . Lack of Transportation (Medical): Not on file  . Lack of Transportation (Non-Medical): Not on file  Physical Activity:   . Days of Exercise per Week: Not on file  . Minutes of Exercise per Session: Not on file  Stress:   . Feeling of Stress : Not on file  Social Connections:   . Frequency of Communication with Friends and Family: Not on file  . Frequency of Social Gatherings with Friends and Family: Not on file  . Attends Religious Services: Not on file  . Active Member of Clubs or Organizations: Not on file  . Attends Theatre manager Meetings: Not on file  . Marital Status: Not on file  Intimate Partner Violence:   . Fear of Current or Ex-Partner: Not on file  . Emotionally Abused: Not on file  . Physically Abused: Not on file  . Sexually Abused: Not on file    Past Surgical History:  Procedure Laterality Date  . BREAST BIOPSY Left 06/2015   benign stereotactic   . BREAST LUMPECTOMY  2011  . CHOLECYSTECTOMY  2011  . DILATION AND CURETTAGE, DIAGNOSTIC / THERAPEUTIC  1993   bladder ?  . TONSILLECTOMY  1953    Family History  Problem Relation Age of Onset  . Cancer Sister 36       bladder  . Pneumonia Mother   . Heart disease Mother     Allergies  Allergen Reactions  . Chlorhexidine Gluconate Swelling and Rash  . Sulfur Swelling    Face and possibly entire body if allowed without intervention.  Marland Kitchen Dextroamphetamine Sulfate     Per patient she is unaware of reaction to this medication.  . Sulfa Antibiotics Swelling  . Penicillins Rash    Where injected    Current Outpatient Medications on File Prior to Visit  Medication Sig Dispense Refill  . aspirin 81 MG tablet Take 81 mg by mouth daily.      . Calcium Carb-Cholecalciferol [phone] MG-UNIT TABS Take 1 tablet by mouth daily.    . Cholecalciferol (VITAMIN D) 2000 UNITS CAPS Take by mouth.      . [employer] SODIUM CHLORIDE 5 % ophthalmic solution INSTILL 1 DROP INTO BOTH EYES 4 TIMES A DAY  99  . DiphenhydrAMINE HCl (BENADRYL PO) Take 1 tablet by mouth at bedtime as needed.    . doxycycline (VIBRAMYCIN) 100 MG capsule Take 1 capsule (100 mg total) by mouth 2 (two) times daily. (Patient not taking: Reported on 02/04/2018) 20 capsule 0  . DUREZOL 0.05 % EMUL   0  . Ibuprofen (MOTRIN PO) Take 1 tablet by mouth at bedtime as needed.     No current facility-administered medications on file prior to visit.    There were no vitals taken for this visit.      Objective:   Physical Exam Vitals and nursing note reviewed.  Constitutional:       General: She is not in acute distress.    Appearance: She is well-developed.  HENT:     Head: Normocephalic and atraumatic.     Comments: Thyroid nodule felt on right upper lobe    Right Ear: Tympanic membrane, ear canal and external ear normal. There is no impacted cerumen.     Left Ear: Tympanic membrane, ear canal and external ear normal. There is no impacted cerumen.     Nose: Nose normal. No congestion or rhinorrhea.  Mouth/Throat:     Mouth: Mucous membranes are moist.     Pharynx: Oropharynx is clear. No oropharyngeal exudate or posterior oropharyngeal erythema.  Eyes:     General:        Right eye: No discharge.        Left eye: No discharge.     Conjunctiva/sclera: Conjunctivae normal.  Neck:     Thyroid: No thyroid mass, thyromegaly or thyroid tenderness.     Trachea: No tracheal deviation.  Cardiovascular:     Rate and Rhythm: Normal rate and regular rhythm.     Pulses: Normal pulses.     Heart sounds: Normal heart sounds. No murmur. No friction rub. No gallop.   Pulmonary:     Effort: Pulmonary effort is normal. No respiratory distress.     Breath sounds: Normal breath sounds. No stridor. No wheezing, rhonchi or rales.  Chest:     Chest wall: No tenderness.  Abdominal:     General: Abdomen is flat. Bowel sounds are normal. There is no distension.     Palpations: Abdomen is soft. There is no mass.     Tenderness: There is no abdominal tenderness. There is no right CVA tenderness, left CVA tenderness, guarding or rebound.     Hernia: No hernia is present.  Musculoskeletal:        General: No swelling, tenderness, deformity or signs of injury. Normal range of motion.     Cervical back: Normal range of motion and neck supple.     Right lower leg: No edema.     Left lower leg: No edema.  Lymphadenopathy:     Cervical: No cervical adenopathy.  Skin:    General: Skin is warm and dry.     Capillary Refill: Capillary refill takes less than 2 seconds.     Coloration:  Skin is not jaundiced or pale.     Findings: No bruising, erythema, lesion or rash.  Neurological:     General: No focal deficit present.     Mental Status: She is alert and oriented to person, place, and time.     Cranial Nerves: No cranial nerve deficit.     Coordination: Coordination normal.  Psychiatric:        Mood and Affect: Mood normal.        Behavior: Behavior normal.        Thought Content: Thought content normal.        Judgment: Judgment normal.        Assessment & Plan:  1. Vitamin D deficiency  - Vitamin D, 25-hydroxy  2. Osteopenia of multiple sites  - TSH - Vitamin D, 25-hydroxy  3. Thyroid disease - Advised [place] of thyroid to look at nodule. She would like to hold off on this for now - CBC with Differential/Platelet - CMP - TSH  Dorothyann Peng, NP

## 2019-05-21 NOTE — Progress Notes (Signed)
She called in for her lab results.   I read her the message from Dorothyann Peng, NP dated 05/16/2019 at 3:01 PM.   She was pleased with the results and did not have any questions.

## 2019-08-05 DIAGNOSIS — H18513 Endothelial corneal dystrophy, bilateral: Secondary | ICD-10-CM | POA: Diagnosis not present

## 2019-08-05 DIAGNOSIS — H353111 Nonexudative age-related macular degeneration, right eye, early dry stage: Secondary | ICD-10-CM | POA: Diagnosis not present

## 2019-08-05 DIAGNOSIS — H40053 Ocular hypertension, bilateral: Secondary | ICD-10-CM | POA: Diagnosis not present

## 2019-08-05 DIAGNOSIS — H52203 Unspecified astigmatism, bilateral: Secondary | ICD-10-CM | POA: Diagnosis not present

## 2019-12-12 ENCOUNTER — Telehealth: Payer: Self-pay | Admitting: *Deleted

## 2019-12-12 NOTE — Telephone Encounter (Signed)
Patient spoke with nurse triage at 12:07 PM.  Patient reports she thinking something bite her hands and neck. Caller states she has swelling. Caller states her jaw is swollen adn she puts ice and taking Bendryl. Caller states it has moved from her neck to her jaw line and it is to the left side of her face and she feels the lumps. Patient reports  looks like bites on hands and neck. left side of face and chin across to right side. itches at times. Used ice and Benadryl. 12.5mg  x 4-5 doses. rash started Wed night. no fever.

## 2020-02-23 DIAGNOSIS — H6123 Impacted cerumen, bilateral: Secondary | ICD-10-CM | POA: Diagnosis not present

## 2020-02-23 DIAGNOSIS — H903 Sensorineural hearing loss, bilateral: Secondary | ICD-10-CM | POA: Diagnosis not present

## 2020-02-23 DIAGNOSIS — H9313 Tinnitus, bilateral: Secondary | ICD-10-CM | POA: Diagnosis not present

## 2020-02-25 DIAGNOSIS — H903 Sensorineural hearing loss, bilateral: Secondary | ICD-10-CM | POA: Diagnosis not present

## 2020-03-16 ENCOUNTER — Encounter: Payer: Self-pay | Admitting: Adult Health

## 2020-03-16 ENCOUNTER — Ambulatory Visit (INDEPENDENT_AMBULATORY_CARE_PROVIDER_SITE_OTHER): Payer: Medicare Other

## 2020-03-16 ENCOUNTER — Other Ambulatory Visit: Payer: Self-pay

## 2020-03-16 DIAGNOSIS — Z23 Encounter for immunization: Secondary | ICD-10-CM | POA: Diagnosis not present

## 2020-06-03 ENCOUNTER — Other Ambulatory Visit: Payer: Self-pay

## 2020-06-03 ENCOUNTER — Ambulatory Visit (INDEPENDENT_AMBULATORY_CARE_PROVIDER_SITE_OTHER): Payer: Medicare Other | Admitting: Adult Health

## 2020-06-03 ENCOUNTER — Encounter: Payer: Self-pay | Admitting: Adult Health

## 2020-06-03 VITALS — BP 124/70 | Temp 98.8°F | Ht 58.5 in | Wt 115.0 lb

## 2020-06-03 DIAGNOSIS — C50912 Malignant neoplasm of unspecified site of left female breast: Secondary | ICD-10-CM | POA: Diagnosis not present

## 2020-06-03 DIAGNOSIS — Z170 Estrogen receptor positive status [ER+]: Secondary | ICD-10-CM | POA: Diagnosis not present

## 2020-06-03 DIAGNOSIS — M8589 Other specified disorders of bone density and structure, multiple sites: Secondary | ICD-10-CM | POA: Diagnosis not present

## 2020-06-03 DIAGNOSIS — E559 Vitamin D deficiency, unspecified: Secondary | ICD-10-CM

## 2020-06-03 DIAGNOSIS — E079 Disorder of thyroid, unspecified: Secondary | ICD-10-CM | POA: Diagnosis not present

## 2020-06-03 LAB — COMPREHENSIVE METABOLIC PANEL
ALT: 16 U/L (ref 0–35)
AST: 21 U/L (ref 0–37)
Albumin: 4.1 g/dL (ref 3.5–5.2)
Alkaline Phosphatase: 68 U/L (ref 39–117)
BUN: 13 mg/dL (ref 6–23)
CO2: 31 mEq/L (ref 19–32)
Calcium: 9.2 mg/dL (ref 8.4–10.5)
Chloride: 106 mEq/L (ref 96–112)
Creatinine, Ser: 0.69 mg/dL (ref 0.40–1.20)
GFR: 79.97 mL/min (ref >60.00)
Glucose, Bld: 95 mg/dL (ref 70–99)
Potassium: 4.6 mEq/L (ref 3.5–5.1)
Sodium: 143 mEq/L (ref 135–145)
Total Bilirubin: 0.5 mg/dL (ref 0.2–1.2)
Total Protein: 6.6 g/dL (ref 6.0–8.3)

## 2020-06-03 LAB — TSH: TSH: 2.54 u[IU]/mL (ref 0.35–4.50)

## 2020-06-03 LAB — MAGNESIUM: Magnesium: 2.1 mg/dL (ref 1.5–2.5)

## 2020-06-03 LAB — CBC WITH DIFFERENTIAL/PLATELET
Basophils Absolute: 0 10*3/uL (ref 0.0–0.1)
Basophils Relative: 0.3 % (ref 0.0–3.0)
Eosinophils Absolute: 0.1 10*3/uL (ref 0.0–0.7)
Eosinophils Relative: 2.9 % (ref 0.0–5.0)
HCT: 41.9 % (ref 36.0–46.0)
Hemoglobin: 14 g/dL (ref 12.0–15.0)
Lymphocytes Relative: 22.7 % (ref 12.0–46.0)
Lymphs Abs: 1.1 10*3/uL (ref 0.7–4.0)
MCHC: 33.3 g/dL (ref 30.0–36.0)
MCV: 90.5 fl (ref 78.0–100.0)
Monocytes Absolute: 0.4 10*3/uL (ref 0.1–1.0)
Monocytes Relative: 8.5 % (ref 3.0–12.0)
Neutro Abs: 3 10*3/uL (ref 1.4–7.7)
Neutrophils Relative %: 65.6 % (ref 43.0–77.0)
Platelets: 128 10*3/uL — ABNORMAL LOW (ref 150.0–400.0)
RBC: 4.63 Mil/uL (ref 3.87–5.11)
RDW: 12.6 % (ref 11.5–15.5)
WBC: 4.6 10*3/uL (ref 4.0–10.5)

## 2020-06-03 LAB — VITAMIN D 25 HYDROXY (VIT D DEFICIENCY, FRACTURES): VITD: 48.85 ng/mL (ref 30.00–100.00)

## 2020-06-03 NOTE — Patient Instructions (Signed)
It was great seeing you today   We will follow up with regarding your blood work   I will see you back in one year or sooner if needed

## 2020-06-03 NOTE — Addendum Note (Signed)
Addended by: Lerry Liner on: 06/03/2020 01:47 PM   Modules accepted: Orders

## 2020-06-03 NOTE — Progress Notes (Signed)
Subjective:    Patient ID: Jessica Ashley, female    DOB: Jun 14, 1934, 86 y.o.   MRN: [id number]  HPI Patient presents for yearly preventative medicine examination. She is a pleasant 86 year old female who  has a past medical history of Breast cancer ([facility]) (2011), Cataract, Fuchs' corneal dystrophy, Osteopenia, Personal history of radiation therapy, Shingles, and Thyroid disease.  Osteopenia-bone density was July 2018.  Currently taking over-the-counter calcium and vitamin D supplements. She refuses further dexa scans  Macular degeneration-followed by ophthalmology on a routine basis  History of thyroid disease-not take any medication Lab Results  Component Value Date   TSH 2.27 05/16/2019   History of breast cancer-longer being seen by oncology.  Last mammogram in February 2019. She refuses further mammograms as this time.   All immunizations and health maintenance protocols were reviewed with the patient and needed orders were placed.  Appropriate screening laboratory values were ordered for the patient including screening of hyperlipidemia, renal function and hepatic function.  Medication reconciliation,  past medical history, social history, problem list and allergies were reviewed in detail with the patient  Goals were established with regard to weight loss, exercise, and  diet in compliance with medications  End of life planning was discussed.  Review of Systems  Constitutional: Negative.   HENT: Negative.   Eyes: Negative.   Respiratory: Negative.   Cardiovascular: Negative.   Gastrointestinal: Negative.   Endocrine: Negative.   Genitourinary: Negative.   Musculoskeletal: Negative.   Skin: Negative.   Allergic/Immunologic: Negative.   Neurological: Negative.   Hematological: Negative.   Psychiatric/Behavioral: Negative.    Past Medical History:  Diagnosis Date  . Breast cancer ([facility]) 2011  . Cataract   . Fuchs' corneal dystrophy   . Osteopenia   . Personal  history of radiation therapy   . Shingles   . Thyroid disease    thyroid surgery    Social History   Socioeconomic History  . Marital status: Married    Spouse name: Not on file  . Number of children: Not on file  . Years of education: Not on file  . Highest education level: Not on file  Occupational History  . Not on file  Tobacco Use  . Smoking status: Never Smoker  . Smokeless tobacco: Never Used  Substance and Sexual Activity  . Alcohol use: No    Alcohol/week: 0.0 standard drinks  . Drug use: No  . Sexual activity: Never    Partners: Male    Comment: married  Other Topics Concern  . Not on file  Social History Narrative   Retired    Married for 57 years    Two daughters ( both live locally)    Two granddaughters - both at [employer] to read and cross stitch. Likes to garden as well.    Social Determinants of Health   Financial Resource Strain: Not on file  Food Insecurity: Not on file  Transportation Needs: Not on file  Physical Activity: Not on file  Stress: Not on file  Social Connections: Not on file  Intimate Partner Violence: Not on file    Past Surgical History:  Procedure Laterality Date  . BREAST BIOPSY Left 06/2015   benign stereotactic   . BREAST LUMPECTOMY  2011  . CHOLECYSTECTOMY  2011  . DILATION AND CURETTAGE, DIAGNOSTIC / THERAPEUTIC  1993   bladder ?  . TONSILLECTOMY  1953    Family History  Problem  Relation Age of Onset  . Cancer Sister 85       bladder  . Pneumonia Mother   . Heart disease Mother     Allergies  Allergen Reactions  . Chlorhexidine Gluconate Swelling and Rash  . Sulfur Swelling    Face and possibly entire body if allowed without intervention.  Marland Kitchen Dextroamphetamine Sulfate     Per patient she is unaware of reaction to this medication.  . Sulfa Antibiotics Swelling  . Penicillins Rash    Where injected    Current Outpatient Medications on File Prior to Visit  Medication Sig Dispense Refill  .  aspirin 81 MG tablet Take 81 mg by mouth daily.      . Calcium Carb-Cholecalciferol [phone] MG-UNIT TABS Take 1 tablet by mouth daily.    . Cholecalciferol (VITAMIN D) 2000 UNITS CAPS Take by mouth.      . CVS SODIUM CHLORIDE 5 % ophthalmic solution INSTILL 1 DROP INTO BOTH EYES 4 TIMES A DAY  99  . DiphenhydrAMINE HCl (BENADRYL PO) Take 1 tablet by mouth at bedtime as needed.    . Ibuprofen (MOTRIN PO) Take 1 tablet by mouth at bedtime as needed.     No current facility-administered medications on file prior to visit.    There were no vitals taken for this visit.      Objective:   Physical Exam Vitals and nursing note reviewed.  Constitutional:      General: She is not in acute distress.    Appearance: Normal appearance. She is well-developed. She is not ill-appearing.  HENT:     Head: Normocephalic and atraumatic.     Right Ear: Tympanic membrane, ear canal and external ear normal. There is no impacted cerumen.     Left Ear: Tympanic membrane, ear canal and external ear normal. There is no impacted cerumen.     Nose: Nose normal. No congestion or rhinorrhea.     Mouth/Throat:     Mouth: Mucous membranes are moist.     Pharynx: Oropharynx is clear. No oropharyngeal exudate or posterior oropharyngeal erythema.  Eyes:     General:        Right eye: No discharge.        Left eye: No discharge.     Extraocular Movements: Extraocular movements intact.     Conjunctiva/sclera: Conjunctivae normal.     Pupils: Pupils are equal, round, and reactive to light.  Neck:     Thyroid: No thyromegaly.     Vascular: No carotid bruit.     Trachea: No tracheal deviation.  Cardiovascular:     Rate and Rhythm: Normal rate and regular rhythm.     Pulses: Normal pulses.     Heart sounds: Normal heart sounds. No murmur heard. No friction rub. No gallop.   Pulmonary:     Effort: Pulmonary effort is normal. No respiratory distress.     Breath sounds: Normal breath sounds. No stridor. No  wheezing, rhonchi or rales.  Chest:     Chest wall: No tenderness.  Abdominal:     General: Abdomen is flat. Bowel sounds are normal. There is no distension.     Palpations: Abdomen is soft. There is no mass.     Tenderness: There is no abdominal tenderness. There is no right CVA tenderness, left CVA tenderness, guarding or rebound.     Hernia: No hernia is present.  Musculoskeletal:        General: No swelling, tenderness, deformity or signs of injury.  Normal range of motion.     Cervical back: Normal range of motion and neck supple.     Right lower leg: No edema.     Left lower leg: No edema.  Lymphadenopathy:     Cervical: No cervical adenopathy.  Skin:    General: Skin is warm and dry.     Coloration: Skin is not jaundiced or pale.     Findings: No bruising, erythema, lesion or rash.  Neurological:     General: No focal deficit present.     Mental Status: She is alert and oriented to person, place, and time.     Cranial Nerves: No cranial nerve deficit.     Sensory: No sensory deficit.     Motor: No weakness.     Coordination: Coordination normal.     Gait: Gait normal.     Deep Tendon Reflexes: Reflexes normal.  Psychiatric:        Mood and Affect: Mood normal.        Behavior: Behavior normal.        Thought Content: Thought content normal.        Judgment: Judgment normal.       Assessment & Plan:  1. Vitamin D deficiency - Continue OTC vitamin D - CBC with Differential/Platelet; Future - Comprehensive metabolic panel; Future - TSH; Future - Magnesium; Future - VITAMIN D 25 Hydroxy (Vit-D Deficiency, Fractures); Future  2. Osteopenia of multiple sites - Continue OTC vitamin D and calcium  - CBC with Differential/Platelet; Future - Comprehensive metabolic panel; Future - TSH; Future - Magnesium; Future - VITAMIN D 25 Hydroxy (Vit-D Deficiency, Fractures); Future  3. Thyroid disease  CBC with Differential/Platelet; Future - Comprehensive metabolic panel;  Future - TSH; Future - Magnesium; Future - VITAMIN D 25 Hydroxy (Vit-D Deficiency, Fractures); Future  4. Malignant neoplasm of left breast in female, estrogen receptor positive, unspecified site of breast ([facility]) - Encouraged mammogram - pt refused at this time  Shirline Frees, NP

## 2020-08-06 DIAGNOSIS — H18513 Endothelial corneal dystrophy, bilateral: Secondary | ICD-10-CM | POA: Diagnosis not present

## 2020-08-06 DIAGNOSIS — H52203 Unspecified astigmatism, bilateral: Secondary | ICD-10-CM | POA: Diagnosis not present

## 2020-11-25 DIAGNOSIS — H6122 Impacted cerumen, left ear: Secondary | ICD-10-CM | POA: Diagnosis not present

## 2020-12-30 DIAGNOSIS — Z8669 Personal history of other diseases of the nervous system and sense organs: Secondary | ICD-10-CM | POA: Diagnosis not present

## 2020-12-30 DIAGNOSIS — Z09 Encounter for follow-up examination after completed treatment for conditions other than malignant neoplasm: Secondary | ICD-10-CM | POA: Diagnosis not present

## 2021-03-10 ENCOUNTER — Other Ambulatory Visit: Payer: Self-pay | Admitting: Adult Health

## 2021-03-10 DIAGNOSIS — Z1231 Encounter for screening mammogram for malignant neoplasm of breast: Secondary | ICD-10-CM

## 2021-03-29 ENCOUNTER — Other Ambulatory Visit: Payer: Self-pay

## 2021-03-29 ENCOUNTER — Ambulatory Visit (INDEPENDENT_AMBULATORY_CARE_PROVIDER_SITE_OTHER): Payer: Medicare Other

## 2021-03-29 DIAGNOSIS — Z23 Encounter for immunization: Secondary | ICD-10-CM | POA: Diagnosis not present

## 2021-04-06 ENCOUNTER — Ambulatory Visit
Admission: RE | Admit: 2021-04-06 | Discharge: 2021-04-06 | Disposition: A | Discharge status: Home / Self Care | Payer: Medicare Other | Source: Ambulatory Visit | Attending: Adult Health | Admitting: Adult Health

## 2021-04-06 ENCOUNTER — Other Ambulatory Visit: Payer: Self-pay

## 2021-04-06 DIAGNOSIS — Z1231 Encounter for screening mammogram for malignant neoplasm of breast: Secondary | ICD-10-CM | POA: Diagnosis not present

## 2021-05-13 ENCOUNTER — Telehealth: Payer: Self-pay | Admitting: Adult Health

## 2021-05-13 NOTE — Telephone Encounter (Signed)
Spoke with patient to schedule Medicare Annual Wellness Visit (AWV) either virtually or in office.   Declined right now wanted call back in spring  Last AWV 11/08/16;  please schedule at anytime with [facility] Nurse Health Advisor 1 or 2   This should be a 45 minute visit.

## 2021-06-03 ENCOUNTER — Encounter: Payer: Medicare Other | Admitting: Adult Health

## 2021-06-24 ENCOUNTER — Encounter: Payer: Medicare Other | Admitting: Adult Health

## 2021-06-24 NOTE — Progress Notes (Deleted)
Subjective:    Patient ID: Jessica Ashley, female    DOB: [birth year], 87 y.o.   MRN: [id number]  HPI Patient presents for yearly preventative medicine examination. She is a pleasant 87 year old female who  has a past medical history of Breast cancer ([place]) (2011), Cataract, Fuchs' corneal dystrophy, Osteopenia, Personal history of radiation therapy, Shingles, and Thyroid disease.  H/o Osteopenia -last bone density screen was July 2018.  She takes over-the-counter calcium and vitamin D supplements.  Refuses further DEXA scans  History of thyroid disease -does not take any medication at this time Lab Results  Component Value Date   TSH 2.54 06/03/2020   History of breast cancer -no longer being seen by oncology.  Last mammogram in February 2019.  She refuses further mammograms at this time  Macular Degeneration -is followed by ophthalmology on a routine basis.  All immunizations and health maintenance protocols were reviewed with the patient and needed orders were placed.  Appropriate screening laboratory values were ordered for the patient including screening of hyperlipidemia, renal function and hepatic function.   Medication reconciliation,  past medical history, social history, problem list and allergies were reviewed in detail with the patient  Goals were established with regard to weight loss, exercise, and  diet in compliance with medications Wt Readings from Last 3 Encounters:  06/03/20 115 lb (52.2 kg)  05/16/19 113 lb (51.3 kg)  01/22/18 119 lb (54 kg)      Review of Systems  Constitutional: Negative.   HENT: Negative.    Eyes: Negative.   Respiratory: Negative.    Cardiovascular: Negative.   Gastrointestinal: Negative.   Endocrine: Negative.   Genitourinary: Negative.   Musculoskeletal: Negative.   Skin: Negative.   Allergic/Immunologic: Negative.   Neurological: Negative.   Hematological: Negative.   Psychiatric/Behavioral: Negative.     Past Medical  History:  Diagnosis Date   Breast cancer [facility]) 2011   Cataract    Fuchs' corneal dystrophy    Osteopenia    Personal history of radiation therapy    Shingles    Thyroid disease    thyroid surgery    Social History   Socioeconomic History   Marital status: Married    Spouse name: Not on file   Number of children: Not on file   Years of education: Not on file   Highest education level: Not on file  Occupational History   Not on file  Tobacco Use   Smoking status: Never   Smokeless tobacco: Never  Substance and Sexual Activity   Alcohol use: No    Alcohol/week: 0.0 standard drinks   Drug use: No   Sexual activity: Never    Partners: Male    Comment: married  Other Topics Concern   Not on file  Social History Narrative   Retired    Married for 54 years    Two daughters ( both live locally)    Two granddaughters - both at [employer] to read and cross stitch. Likes to garden as well.    Social Determinants of Health   Financial Resource Strain: Not on file  Food Insecurity: Not on file  Transportation Needs: Not on file  Physical Activity: Not on file  Stress: Not on file  Social Connections: Not on file  Intimate Partner Violence: Not on file    Past Surgical History:  Procedure Laterality Date   BREAST BIOPSY Left 06/2015   benign stereotactic  BREAST LUMPECTOMY  2011   CHOLECYSTECTOMY  2011   DILATION AND CURETTAGE, DIAGNOSTIC / THERAPEUTIC  1993   bladder ?   TONSILLECTOMY  1953    Family History  Problem Relation Age of Onset   Cancer Sister 76       bladder   Pneumonia Mother    Heart disease Mother     Allergies  Allergen Reactions   Chlorhexidine Gluconate Swelling and Rash   Elemental Sulfur Swelling    Face and possibly entire body if allowed without intervention.   Dextroamphetamine Sulfate     Per patient she is unaware of reaction to this medication.   Sulfa Antibiotics Swelling   Penicillins Rash    Where injected     Current Outpatient Medications on File Prior to Visit  Medication Sig Dispense Refill   aspirin 81 MG tablet Take 81 mg by mouth daily.     Calcium Carb-Cholecalciferol [id number] MG-UNIT TABS Take 1 tablet by mouth daily.     Cholecalciferol (VITAMIN D) 2000 UNITS CAPS Take by mouth.     [employer]S SODIUM CHLORIDE 5 % ophthalmic solution INSTILL 1 DROP INTO BOTH EYES 4 TIMES A DAY  99   DiphenhydrAMINE HCl (BENADRYL PO) Take 1 tablet by mouth at bedtime as needed.     Ibuprofen (MOTRIN PO) Take 1 tablet by mouth at bedtime as needed.     No current facility-administered medications on file prior to visit.    There were no vitals taken for this visit.       Objective:   Physical Exam Vitals and nursing note reviewed.  Constitutional:      General: She is not in acute distress.    Appearance: Normal appearance. She is well-developed. She is not ill-appearing.  HENT:     Head: Normocephalic and atraumatic.     Right Ear: Tympanic membrane, ear canal and external ear normal. There is no impacted cerumen.     Left Ear: Tympanic membrane, ear canal and external ear normal. There is no impacted cerumen.     Nose: Nose normal. No congestion or rhinorrhea.     Mouth/Throat:     Mouth: Mucous membranes are moist.     Pharynx: Oropharynx is clear. No oropharyngeal exudate or posterior oropharyngeal erythema.  Eyes:     General:        Right eye: No discharge.        Left eye: No discharge.     Extraocular Movements: Extraocular movements intact.     Conjunctiva/sclera: Conjunctivae normal.     Pupils: Pupils are equal, round, and reactive to light.  Neck:     Thyroid: No thyromegaly.     Vascular: No carotid bruit.     Trachea: No tracheal deviation.  Cardiovascular:     Rate and Rhythm: Normal rate and regular rhythm.     Pulses: Normal pulses.     Heart sounds: Normal heart sounds. No murmur heard.   No friction rub. No gallop.  Pulmonary:     Effort: Pulmonary effort is normal.  No respiratory distress.     Breath sounds: Normal breath sounds. No stridor. No wheezing, rhonchi or rales.  Chest:     Chest wall: No tenderness.  Abdominal:     General: Abdomen is flat. Bowel sounds are normal. There is no distension.     Palpations: Abdomen is soft. There is no mass.     Tenderness: There is no abdominal tenderness. There is no right CVA  tenderness, left CVA tenderness, guarding or rebound.     Hernia: No hernia is present.  Musculoskeletal:        General: No swelling, tenderness, deformity or signs of injury. Normal range of motion.     Cervical back: Normal range of motion and neck supple.     Right lower leg: No edema.     Left lower leg: No edema.  Lymphadenopathy:     Cervical: No cervical adenopathy.  Skin:    General: Skin is warm and dry.     Coloration: Skin is not jaundiced or pale.     Findings: No bruising, erythema, lesion or rash.  Neurological:     General: No focal deficit present.     Mental Status: She is alert and oriented to person, place, and time.     Cranial Nerves: No cranial nerve deficit.     Sensory: No sensory deficit.     Motor: No weakness.     Coordination: Coordination normal.     Gait: Gait normal.     Deep Tendon Reflexes: Reflexes normal.  Psychiatric:        Mood and Affect: Mood normal.        Behavior: Behavior normal.        Thought Content: Thought content normal.        Judgment: Judgment normal.        Assessment & Plan:

## 2021-07-01 ENCOUNTER — Ambulatory Visit (INDEPENDENT_AMBULATORY_CARE_PROVIDER_SITE_OTHER): Payer: Medicare Other | Admitting: Adult Health

## 2021-07-01 ENCOUNTER — Encounter: Payer: Self-pay | Admitting: Adult Health

## 2021-07-01 VITALS — BP 120/60 | HR 78 | Temp 98.7°F | Ht <= 58 in | Wt 114.0 lb

## 2021-07-01 DIAGNOSIS — M8589 Other specified disorders of bone density and structure, multiple sites: Secondary | ICD-10-CM

## 2021-07-01 DIAGNOSIS — E559 Vitamin D deficiency, unspecified: Secondary | ICD-10-CM

## 2021-07-01 DIAGNOSIS — E079 Disorder of thyroid, unspecified: Secondary | ICD-10-CM | POA: Diagnosis not present

## 2021-07-01 DIAGNOSIS — Z136 Encounter for screening for cardiovascular disorders: Secondary | ICD-10-CM

## 2021-07-01 DIAGNOSIS — Z13220 Encounter for screening for lipoid disorders: Secondary | ICD-10-CM

## 2021-07-01 LAB — LIPID PANEL
Cholesterol: 244 mg/dL — ABNORMAL HIGH (ref 0–200)
HDL: 94.9 mg/dL (ref >39.00)
LDL Cholesterol: 134 mg/dL — ABNORMAL HIGH (ref 0–99)
NonHDL: 148.73
Total CHOL/HDL Ratio: 3
Triglycerides: 73 mg/dL (ref 0.0–149.0)
VLDL: 14.6 mg/dL (ref 0.0–40.0)

## 2021-07-01 LAB — CBC WITH DIFFERENTIAL/PLATELET
Basophils Absolute: 0 10*3/uL (ref 0.0–0.1)
Basophils Relative: 0.5 % (ref 0.0–3.0)
Eosinophils Absolute: 0.2 10*3/uL (ref 0.0–0.7)
Eosinophils Relative: 4.7 % (ref 0.0–5.0)
HCT: 41.7 % (ref 36.0–46.0)
Hemoglobin: 13.8 g/dL (ref 12.0–15.0)
Lymphocytes Relative: 27 % (ref 12.0–46.0)
Lymphs Abs: 1.1 10*3/uL (ref 0.7–4.0)
MCHC: 33.1 g/dL (ref 30.0–36.0)
MCV: 90.9 fl (ref 78.0–100.0)
Monocytes Absolute: 0.4 10*3/uL (ref 0.1–1.0)
Monocytes Relative: 10 % (ref 3.0–12.0)
Neutro Abs: 2.4 10*3/uL (ref 1.4–7.7)
Neutrophils Relative %: 57.8 % (ref 43.0–77.0)
Platelets: 121 10*3/uL — ABNORMAL LOW (ref 150.0–400.0)
RBC: 4.59 Mil/uL (ref 3.87–5.11)
RDW: 12.4 % (ref 11.5–15.5)
WBC: 4.1 10*3/uL (ref 4.0–10.5)

## 2021-07-01 LAB — COMPREHENSIVE METABOLIC PANEL
ALT: 13 U/L (ref 0–35)
AST: 22 U/L (ref 0–37)
Albumin: 4.1 g/dL (ref 3.5–5.2)
Alkaline Phosphatase: 66 U/L (ref 39–117)
BUN: 11 mg/dL (ref 6–23)
CO2: 32 mEq/L (ref 19–32)
Calcium: 9.3 mg/dL (ref 8.4–10.5)
Chloride: 103 mEq/L (ref 96–112)
Creatinine, Ser: 0.73 mg/dL (ref 0.40–1.20)
GFR: 75.21 mL/min (ref >60.00)
Glucose, Bld: 89 mg/dL (ref 70–99)
Potassium: 4.2 mEq/L (ref 3.5–5.1)
Sodium: 140 mEq/L (ref 135–145)
Total Bilirubin: 0.6 mg/dL (ref 0.2–1.2)
Total Protein: 7.2 g/dL (ref 6.0–8.3)

## 2021-07-01 LAB — TSH: TSH: 3.7 u[IU]/mL (ref 0.35–5.50)

## 2021-07-01 LAB — VITAMIN D 25 HYDROXY (VIT D DEFICIENCY, FRACTURES): VITD: 50.72 ng/mL (ref 30.00–100.00)

## 2021-07-01 NOTE — Patient Instructions (Addendum)
It was great seeing you today   We will follow up with you regarding your lab work   Please let me know if you need anything   Start using Benefiber or Metamucil

## 2021-07-01 NOTE — Progress Notes (Signed)
Subjective:    Patient ID: Jessica Ashley, female    DOB: 1934/11/12, 87 y.o.   MRN: [id number]  HPI  Patient presents for yearly preventative medicine examination. She is a pleasant 87 year old female who  has a past medical history of Breast cancer ([place]) (2011), Cataract, Fuchs' corneal dystrophy, Osteopenia, Personal history of radiation therapy, Shingles, and Thyroid disease.  Osteopenia -last bone density screen in 2018.  Takes vitamin D and calcium.  Refuses follow-up bone density  Macular Degeneration - Is followed by opthamology on a routine basis  History of Thyroid Disease - not currently on medication  Lab Results  Component Value Date   TSH 2.54 06/03/2020   All immunizations and health maintenance protocols were reviewed with the patient and needed orders were placed.  Appropriate screening laboratory values were ordered for the patient including screening of hyperlipidemia, renal function and hepatic function.  Medication reconciliation,  past medical history, social history, problem list and allergies were reviewed in detail with the patient  Goals were established with regard to weight loss, exercise, and  diet in compliance with medications. She stays active and eats healthy   Wt Readings from Last 3 Encounters:  07/01/21 114 lb (51.7 kg)  06/03/20 115 lb (52.2 kg)  05/16/19 113 lb (51.3 kg)    Has no acute complaints  Review of Systems  Constitutional: Negative.   HENT:  Positive for hearing loss.   Eyes: Negative.   Respiratory: Negative.    Cardiovascular: Negative.   Gastrointestinal: Negative.   Endocrine: Negative.   Genitourinary: Negative.   Musculoskeletal:  Positive for arthralgias.  Skin: Negative.   Allergic/Immunologic: Negative.   Neurological: Negative.   Hematological: Negative.   Psychiatric/Behavioral: Negative.    Past Medical History:  Diagnosis Date   Breast cancer [facility]) 2011   Cataract    Fuchs' corneal dystrophy     Osteopenia    Personal history of radiation therapy    Shingles    Thyroid disease    thyroid surgery    Social History   Socioeconomic History   Marital status: Married    Spouse name: Not on file   Number of children: Not on file   Years of education: Not on file   Highest education level: Not on file  Occupational History   Not on file  Tobacco Use   Smoking status: Never   Smokeless tobacco: Never  Substance and Sexual Activity   Alcohol use: No    Alcohol/week: 0.0 standard drinks   Drug use: No   Sexual activity: Never    Partners: Male    Comment: married  Other Topics Concern   Not on file  Social History Narrative   Retired    Married for 10 years    Two daughters ( both live locally)    Two granddaughters - both at [employer] to read and cross stitch. Likes to garden as well.    Social Determinants of Health   Financial Resource Strain: Not on file  Food Insecurity: Not on file  Transportation Needs: Not on file  Physical Activity: Not on file  Stress: Not on file  Social Connections: Not on file  Intimate Partner Violence: Not on file    Past Surgical History:  Procedure Laterality Date   BREAST BIOPSY Left 06/2015   benign stereotactic    BREAST LUMPECTOMY  2011   CHOLECYSTECTOMY  2011   DILATION AND CURETTAGE, DIAGNOSTIC /  THERAPEUTIC  1993   bladder ?   TONSILLECTOMY  1953    Family History  Problem Relation Age of Onset   Cancer Sister 49       bladder   Pneumonia Mother    Heart disease Mother     Allergies  Allergen Reactions   Chlorhexidine Gluconate Swelling and Rash   Elemental Sulfur Swelling    Face and possibly entire body if allowed without intervention.   Dextroamphetamine Sulfate     Per patient she is unaware of reaction to this medication.   Sulfa Antibiotics Swelling   Penicillins Rash    Where injected    Current Outpatient Medications on File Prior to Visit  Medication Sig Dispense Refill   aspirin 81  MG tablet Take 81 mg by mouth daily.     Calcium Carb-Cholecalciferol [phone] MG-UNIT TABS Take 1 tablet by mouth daily.     Cholecalciferol (VITAMIN D) 2000 UNITS CAPS Take by mouth.     CVS SODIUM CHLORIDE 5 % ophthalmic solution INSTILL 1 DROP INTO BOTH EYES 4 TIMES A DAY  99   dexamethasone (DECADRON) 0.1 % ophthalmic solution Instill 2 drops into both ears twice daily for 7 days.     DiphenhydrAMINE HCl (BENADRYL PO) Take 1 tablet by mouth at bedtime as needed.     Ibuprofen (MOTRIN PO) Take 1 tablet by mouth at bedtime as needed.     No current facility-administered medications on file prior to visit.    BP 120/60    Pulse 78    Temp 98.7 F (37.1 C) (Oral)    Ht 4\' 10"  (1.473 m)    Wt 114 lb (51.7 kg)    SpO2 99%    BMI 23.83 kg/m       Objective:   Physical Exam Vitals and nursing note reviewed.  Constitutional:      General: She is not in acute distress.    Appearance: Normal appearance. She is well-developed and underweight. She is not ill-appearing.  HENT:     Head: Normocephalic and atraumatic.     Right Ear: Tympanic membrane, ear canal and external ear normal. There is no impacted cerumen.     Left Ear: Tympanic membrane, ear canal and external ear normal. There is no impacted cerumen.     Nose: Nose normal. No congestion or rhinorrhea.     Mouth/Throat:     Mouth: Mucous membranes are moist.     Pharynx: Oropharynx is clear. No oropharyngeal exudate or posterior oropharyngeal erythema.  Eyes:     General:        Right eye: No discharge.        Left eye: No discharge.     Extraocular Movements: Extraocular movements intact.     Conjunctiva/sclera: Conjunctivae normal.     Pupils: Pupils are equal, round, and reactive to light.  Neck:     Thyroid: No thyromegaly.     Vascular: No carotid bruit.     Trachea: No tracheal deviation.  Cardiovascular:     Rate and Rhythm: Normal rate and regular rhythm.     Pulses: Normal pulses.     Heart sounds: Normal heart  sounds. No murmur heard.   No friction rub. No gallop.  Pulmonary:     Effort: Pulmonary effort is normal. No respiratory distress.     Breath sounds: Normal breath sounds. No stridor. No wheezing, rhonchi or rales.  Chest:     Chest wall: No tenderness.  Abdominal:  General: Abdomen is flat. Bowel sounds are normal. There is no distension.     Palpations: Abdomen is soft. There is no mass.     Tenderness: There is no abdominal tenderness. There is no right CVA tenderness, left CVA tenderness, guarding or rebound.     Hernia: No hernia is present.  Musculoskeletal:        General: No swelling, tenderness, deformity or signs of injury. Normal range of motion.     Cervical back: Normal range of motion and neck supple.     Right lower leg: No edema.     Left lower leg: No edema.  Lymphadenopathy:     Cervical: No cervical adenopathy.  Skin:    General: Skin is warm and dry.     Coloration: Skin is not jaundiced or pale.     Findings: No bruising, erythema, lesion or rash.  Neurological:     General: No focal deficit present.     Mental Status: She is alert and oriented to person, place, and time.     Cranial Nerves: No cranial nerve deficit.     Sensory: No sensory deficit.     Motor: No weakness.     Coordination: Coordination normal.     Gait: Gait normal.     Deep Tendon Reflexes: Reflexes normal.  Psychiatric:        Mood and Affect: Mood normal.        Behavior: Behavior normal.        Thought Content: Thought content normal.        Judgment: Judgment normal.      Assessment & Plan:  1. Vitamin D deficiency  - CBC with Differential/Platelet; Future - Comprehensive metabolic panel; Future - Lipid panel; Future - TSH; Future - VITAMIN D 25 Hydroxy (Vit-D Deficiency, Fractures); Future - VITAMIN D 25 Hydroxy (Vit-D Deficiency, Fractures) - TSH - Lipid panel - Comprehensive metabolic panel - CBC with Differential/Platelet  2. Osteopenia of multiple sites -  Continue Vitamin D and Calcium. Stay active with weight bearing exercises - CBC with Differential/Platelet; Future - Comprehensive metabolic panel; Future - Lipid panel; Future - TSH; Future - VITAMIN D 25 Hydroxy (Vit-D Deficiency, Fractures); Future - VITAMIN D 25 Hydroxy (Vit-D Deficiency, Fractures) - TSH - Lipid panel - Comprehensive metabolic panel - CBC with Differential/Platelet  3. Thyroid disease - Consider synthroid - CBC with Differential/Platelet; Future - Comprehensive metabolic panel; Future - Lipid panel; Future - TSH; Future - VITAMIN D 25 Hydroxy (Vit-D Deficiency, Fractures); Future - VITAMIN D 25 Hydroxy (Vit-D Deficiency, Fractures) - TSH - Lipid panel - Comprehensive metabolic panel - CBC with Differential/Platelet  4. Encounter for lipid screening for cardiovascular disease - Consider statin  - CBC with Differential/Platelet; Future - Comprehensive metabolic panel; Future - Lipid panel; Future - TSH; Future - VITAMIN D 25 Hydroxy (Vit-D Deficiency, Fractures); Future - VITAMIN D 25 Hydroxy (Vit-D Deficiency, Fractures) - TSH - Lipid panel - Comprehensive metabolic panel - CBC with Differential/Platelet  Dorothyann Peng, NP

## 2021-07-06 ENCOUNTER — Other Ambulatory Visit: Payer: Self-pay | Admitting: Adult Health

## 2021-07-06 DIAGNOSIS — E782 Mixed hyperlipidemia: Secondary | ICD-10-CM

## 2021-08-08 DIAGNOSIS — H18513 Endothelial corneal dystrophy, bilateral: Secondary | ICD-10-CM | POA: Diagnosis not present

## 2021-08-08 DIAGNOSIS — H52203 Unspecified astigmatism, bilateral: Secondary | ICD-10-CM | POA: Diagnosis not present

## 2021-08-08 DIAGNOSIS — Z961 Presence of intraocular lens: Secondary | ICD-10-CM | POA: Diagnosis not present

## 2021-08-18 ENCOUNTER — Telehealth: Payer: Self-pay | Admitting: Adult Health

## 2021-08-18 NOTE — Telephone Encounter (Signed)
Spoke with patient to schedule Medicare Annual Wellness Visit (AWV) either virtually or in office. ? ?Patient didn't want to schedule right now  she wanted a call back in aug ? ?Last AWV 11/08/16 ?; please schedule at anytime with [facility] Nurse Health Advisor 1 or 2 ? ? ? ?

## 2021-10-06 DIAGNOSIS — L821 Other seborrheic keratosis: Secondary | ICD-10-CM | POA: Diagnosis not present

## 2021-10-06 DIAGNOSIS — D692 Other nonthrombocytopenic purpura: Secondary | ICD-10-CM | POA: Diagnosis not present

## 2021-10-06 DIAGNOSIS — D1801 Hemangioma of skin and subcutaneous tissue: Secondary | ICD-10-CM | POA: Diagnosis not present

## 2021-12-06 ENCOUNTER — Other Ambulatory Visit (INDEPENDENT_AMBULATORY_CARE_PROVIDER_SITE_OTHER): Payer: Medicare Other

## 2021-12-06 DIAGNOSIS — E782 Mixed hyperlipidemia: Secondary | ICD-10-CM | POA: Diagnosis not present

## 2021-12-06 LAB — LIPID PANEL
Cholesterol: 202 mg/dL — ABNORMAL HIGH (ref 0–200)
HDL: 76.4 mg/dL
LDL Cholesterol: 112 mg/dL — ABNORMAL HIGH (ref 0–99)
NonHDL: 125.18
Total CHOL/HDL Ratio: 3
Triglycerides: 64 mg/dL (ref 0.0–149.0)
VLDL: 12.8 mg/dL (ref 0.0–40.0)

## 2022-01-09 ENCOUNTER — Ambulatory Visit (INDEPENDENT_AMBULATORY_CARE_PROVIDER_SITE_OTHER): Payer: Medicare Other

## 2022-01-09 VITALS — Ht <= 58 in

## 2022-01-09 DIAGNOSIS — Z0000 Encounter for general adult medical examination without abnormal findings: Secondary | ICD-10-CM | POA: Diagnosis not present

## 2022-01-09 NOTE — Progress Notes (Signed)
Subjective:   Jessica Ashley is a 87 y.o. female who presents for Medicare Annual (Subsequent) preventive examination.  Review of Systems    Virtual Visit via Telephone Note  I connected with  Jessica Ashley on 01/09/22 at  2:00 PM EDT by telephone and verified that I am speaking with the correct person using two identifiers.  Location: Patient: Home Provider: Office Persons participating in the virtual visit: patient/Nurse Health Advisor   I discussed the limitations, risks, security and privacy concerns of performing an evaluation and management service by telephone and the availability of in person appointments. The patient expressed understanding and agreed to proceed.  Interactive audio and video telecommunications were attempted between this nurse and patient, however failed, due to patient having technical difficulties OR patient did not have access to video capability.  We continued and completed visit with audio only.  Some vital signs may be absent or patient reported.   Criselda Peaches, LPN  Cardiac Risk Factors include: advanced age (>70mn, >>72women)     Objective:    Today's Vitals   01/09/22 1409  Height: '4\' 10"'$  (1.473 m)   Body mass index is 23.83 kg/m.     01/09/2022    2:21 PM 09/01/2014    2:12 PM 03/02/2014   10:56 AM  Advanced Directives  Does Patient Have a Medical Advance Directive? Yes Yes No  Type of [place] will    Does patient want to make changes to medical advance directive? No - Patient declined    Copy of HNemahain Chart? No - copy requested No - copy requested     Current Medications (verified) Outpatient Encounter Medications as of 01/09/2022  Medication Sig   aspirin 81 MG tablet Take 81 mg by mouth daily.   Calcium Carb-Cholecalciferol [phone] MG-UNIT TABS Take 1 tablet by mouth daily.   Cholecalciferol (VITAMIN D) 2000 UNITS CAPS Take by mouth.   [employer] SODIUM  CHLORIDE 5 % ophthalmic solution INSTILL 1 DROP INTO BOTH EYES 4 TIMES A DAY   dexamethasone (DECADRON) 0.1 % ophthalmic solution Instill 2 drops into both ears twice daily for 7 days.   DiphenhydrAMINE HCl (BENADRYL PO) Take 1 tablet by mouth at bedtime as needed.   Ibuprofen (MOTRIN PO) Take 1 tablet by mouth at bedtime as needed.   No facility-administered encounter medications on file as of 01/09/2022.    Allergies (verified) Chlorhexidine gluconate, Elemental sulfur, Dextroamphetamine sulfate, Sulfa antibiotics, and Penicillins   History: Past Medical History:  Diagnosis Date   Breast cancer ([facility] 2011   Cataract    Fuchs' corneal dystrophy    Osteopenia    Personal history of radiation therapy    Shingles    Thyroid disease    thyroid surgery   Past Surgical History:  Procedure Laterality Date   BREAST BIOPSY Left 06/2015   benign stereotactic    BREAST LUMPECTOMY  2011   CHOLECYSTECTOMY  2011   DILATION AND CURETTAGE, DIAGNOSTIC / THERAPEUTIC  1993   bladder ?   TONSILLECTOMY  1953   Family History  Problem Relation Age of Onset   Cancer Sister 775      bladder   Pneumonia Mother    Heart disease Mother    Social History   Socioeconomic History   Marital status: Married    Spouse name: Not on file   Number of children: Not on file   Years of education: Not  on file   Highest education level: Not on file  Occupational History   Not on file  Tobacco Use   Smoking status: Never   Smokeless tobacco: Never  Substance and Sexual Activity   Alcohol use: No    Alcohol/week: 0.0 standard drinks of alcohol   Drug use: No   Sexual activity: Never    Partners: Male    Comment: married  Other Topics Concern   Not on file  Social History Narrative   Retired    Married for 64 years    Two daughters ( both live locally)    Two granddaughters - both at [employer] to read and cross stitch. Likes to garden as well.    Social Determinants of Health    Financial Resource Strain: Low Risk  (01/09/2022)   Overall Financial Resource Strain (CARDIA)    Difficulty of Paying Living Expenses: Not hard at all  Food Insecurity: No Food Insecurity (01/09/2022)   Hunger Vital Sign    Worried About Running Out of Food in the Last Year: Never true    Ran Out of Food in the Last Year: Never true  Transportation Needs: No Transportation Needs (01/09/2022)   PRAPARE - Hydrologist (Medical): No    Lack of Transportation (Non-Medical): No  Physical Activity: Sufficiently Active (01/09/2022)   Exercise Vital Sign    Days of Exercise per Week: 7 days    Minutes of Exercise per Session: 60 min  Stress: No Stress Concern Present (01/09/2022)   [place]    Feeling of Stress : Not at all  Social Connections: Moderately Integrated (01/09/2022)   Social Connection and Isolation Panel [NHANES]    Frequency of Communication with Friends and Family: More than three times a week    Frequency of Social Gatherings with Friends and Family: More than three times a week    Attends Religious Services: More than 4 times per year    Active Member of [employer] or Organizations: Yes    Attends Archivist Meetings: More than 4 times per year    Marital Status: Widowed    Tobacco Counseling Counseling given: Not Answered   Clinical Intake:  Pre-visit preparation completed: No  Pain : No/denies pain     BMI - recorded: 23.83 Nutritional Status: BMI of 19-24  Normal Nutritional Risks: None Diabetes: No  How often do you need to have someone help you when you read instructions, pamphlets, or other written materials from your doctor or pharmacy?: 1 - Never  Diabetic?  No  Interpreter Needed?: No  Information entered by :: Rolene Arbour LPN   Activities of Daily Living    01/09/2022    2:17 PM 07/01/2021   10:45 AM  In your present state of health, do you  have any difficulty performing the following activities:  Hearing? 1 0  Comment Wears hearing aids   Vision? 0 0  Difficulty concentrating or making decisions? 0 0  Walking or climbing stairs? 0 0  Dressing or bathing? 0 0  Doing errands, shopping? 0 0  Preparing Food and eating ? N   Using the Toilet? N   In the past six months, have you accidently leaked urine? Y   Comment Wears pads. Followed by PCP   Do you have problems with loss of bowel control? N   Managing your Medications? N   Managing  your Finances? N   Housekeeping or managing your Housekeeping? N     Patient Care Team: Dorothyann Peng, NP as PCP - General (Family Medicine) Delice Bison, Charlestine Massed, NP as Nurse Practitioner (Hematology and Oncology) Luberta Mutter, MD as Consulting Physician (Ophthalmology) Garry Heater, DDS as Consulting Physician (Dentistry)  Indicate any recent Medical Services you may have received from other than [facility] providers in the past year (date may be approximate).     Assessment:   This is a routine wellness examination for Jessica Ashley.  Hearing/Vision screen Hearing Screening - Comments:: Wears hearing aids Vision Screening - Comments:: Wears glaseese. Followed by Dr Celene Squibb  Dietary issues and exercise activities discussed: Exercise limited by: None identified   Goals Addressed               This Visit's Progress     Patient stated (pt-stated)        Keep my cholesterol levels down.       Depression Screen    01/09/2022    2:15 PM 07/01/2021   10:42 AM 06/03/2020    1:06 PM 01/22/2018    9:01 AM 01/17/2017    8:59 AM 11/08/2016    1:07 PM 10/12/2015   10:52 AM  PHQ 2/9 Scores  PHQ - 2 Score 0 0 0 0 0 0 0    Fall Risk    01/09/2022    2:19 PM 07/01/2021   10:42 AM 06/03/2020    1:05 PM 01/22/2018    9:14 AM 01/22/2018    9:01 AM  [place] in the past year? 0 0 0  Yes  Number falls in past yr: 0 0   2 or more  Injury with Fall? 0 0  Yes Yes  Risk for fall due  to : No Fall Risks      Follow up    Falls evaluation completed;Falls prevention discussed;Education provided;Follow up appointment     FALL RISK PREVENTION PERTAINING TO THE HOME:  Any stairs in or around the home? Yes  If so, are there any without handrails? No  Home free of loose throw rugs in walkways, pet beds, electrical cords, etc? Yes  Adequate lighting in your home to reduce risk of falls? Yes   ASSISTIVE DEVICES UTILIZED TO PREVENT FALLS:  Life alert? No  Use of a cane, walker or w/c? No  Grab bars in the bathroom? Yes  Shower chair or bench in shower? No  Elevated toilet seat or a handicapped toilet? No   TIMED UP AND GO:  Was the test performed? No . Audio Visit   Cognitive Function:    11/08/2016    1:20 PM  MMSE - Mini Mental State Exam  Orientation to time 5  Orientation to Place 5  Registration 3  Attention/ Calculation 5  Recall 3  Language- name 2 objects 2  Language- repeat 1  Language- follow 3 step command 3  Language- read & follow direction 1  Write a sentence 1  Copy design 1  Total score 30        01/09/2022    2:21 PM  6CIT Screen  What Year? 0 points  What month? 0 points  What time? 0 points  Count back from 20 0 points  Months in reverse 0 points  Repeat phrase 0 points  Total Score 0 points    Immunizations Immunization History  Administered Date(s) Administered   Fluad Quad(high Dose 65+) 03/06/2019, 03/16/2020, 03/29/2021   Influenza  Split 03/01/2011, 02/16/2012   Influenza Whole 03/08/2007, 03/03/2008, 03/04/2009, 03/03/2010   Influenza, High Dose Seasonal PF 03/05/2013, 03/02/2015, 02/22/2016, 03/30/2017, 02/28/2018   Influenza,inj,Quad PF,6+ Mos 02/20/2014   Pneumococcal Conjugate-13 09/17/2013   Pneumococcal Polysaccharide-23 06/04/2009   Td 05/29/2006    TDAP status: Up to date  Flu Vaccine status: Up to date  Pneumococcal vaccine status: Up to date  Covid-19 vaccine status: Declined, Education has been  provided regarding the importance of this vaccine but patient still declined. Advised may receive this vaccine at local pharmacy or Health Dept.or vaccine clinic. Aware to provide a copy of the vaccination record if obtained from local pharmacy or Health Dept. Verbalized acceptance and understanding.  Qualifies for Shingles Vaccine? Yes   Zostavax completed No   Shingrix Completed?: No.    Education has been provided regarding the importance of this vaccine. Patient has been advised to call insurance company to determine out of pocket expense if they have not yet received this vaccine. Advised may also receive vaccine at local pharmacy or Health Dept. Verbalized acceptance and understanding.  Screening Tests Health Maintenance  Topic Date Due   INFLUENZA VACCINE  12/27/2021   Zoster Vaccines- Shingrix (1 of 2) 04/11/2022 (Originally 06/28/1953)   Pneumonia Vaccine 8+ Years old  Completed   DEXA SCAN  Completed   HPV VACCINES  Aged Out   COVID-19 Vaccine  Discontinued    Health Maintenance  Health Maintenance Due  Topic Date Due   INFLUENZA VACCINE  12/27/2021    Colorectal cancer screening: No longer required.   Mammogram status: No longer required due to Age.  Bone Density status: Completed 12/25/16. Results reflect: Bone density results: NORMAL. Repeat every   years.  Lung Cancer Screening: (Low Dose CT Chest recommended if Age 42-80 years, 30 pack-year currently smoking OR have quit w/in 15years.) does not qualify.     Additional Screening:  Hepatitis C Screening: does not qualify; Completed   Vision Screening: Recommended annual ophthalmology exams for early detection of glaucoma and other disorders of the eye. Is the patient up to date with their annual eye exam?  Yes  Who is the provider or what is the name of the office in which the patient attends annual eye exams? Dr Celene Squibb If pt is not established with a provider, would they like to be referred to a provider to  establish care? No .   Dental Screening: Recommended annual dental exams for proper oral hygiene  Community Resource Referral / Chronic Care Management:  CRR required this visit?  No   CCM required this visit?  No      Plan:     I have personally reviewed and noted the following in the patient's chart:   Medical and social history Use of alcohol, tobacco or illicit drugs  Current medications and supplements including opioid prescriptions.  Functional ability and status Nutritional status Physical activity Advanced directives List of other physicians Hospitalizations, surgeries, and ER visits in previous 12 months Vitals Screenings to include cognitive, depression, and falls Referrals and appointments  In addition, I have reviewed and discussed with patient certain preventive protocols, quality metrics, and best practice recommendations. A written personalized care plan for preventive services as well as general preventive health recommendations were provided to patient.     Criselda Peaches, LPN   09/04/8117   Nurse Notes: None

## 2022-01-09 NOTE — Patient Instructions (Addendum)
Ms. Jessica Ashley , Thank you for taking time to come for your Medicare Wellness Visit. I appreciate your ongoing commitment to your health goals. Please review the following plan we discussed and let me know if I can assist you in the future.   These are the goals we discussed:  Goals       Maintain current health status      Patient stated (pt-stated)      Keep my cholesterol levels down.        This is a list of the screening recommended for you and due dates:  Health Maintenance  Topic Date Due   Flu Shot  12/27/2021   Zoster (Shingles) Vaccine (1 of 2) 04/11/2022*   Pneumonia Vaccine  Completed   DEXA scan (bone density measurement)  Completed   HPV Vaccine  Aged Out   COVID-19 Vaccine  Discontinued  *Topic was postponed. The date shown is not the original due date.    Advanced directives: Yes  Conditions/risks identified: None  Next appointment: Follow up in one year for your annual wellness visit     Preventive Care 65 Years and Older, Female Preventive care refers to lifestyle choices and visits with your health care provider that can promote health and wellness. What does preventive care include? A yearly physical exam. This is also called an annual well check. Dental exams once or twice a year. Routine eye exams. Ask your health care provider how often you should have your eyes checked. Personal lifestyle choices, including: Daily care of your teeth and gums. Regular physical activity. Eating a healthy diet. Avoiding tobacco and drug use. Limiting alcohol use. Practicing safe sex. Taking low-dose aspirin every day. Taking vitamin and mineral supplements as recommended by your health care provider. What happens during an annual well check? The services and screenings done by your health care provider during your annual well check will depend on your age, overall health, lifestyle risk factors, and family history of disease. Counseling  Your health care provider  may ask you questions about your: Alcohol use. Tobacco use. Drug use. Emotional well-being. Home and relationship well-being. Sexual activity. Eating habits. History of falls. Memory and ability to understand (cognition). Work and work Statistician. Reproductive health. Screening  You may have the following tests or measurements: Height, weight, and BMI. Blood pressure. Lipid and cholesterol levels. These may be checked every 5 years, or more frequently if you are over 56 years old. Skin check. Lung cancer screening. You may have this screening every year starting at age 9 if you have a 30-pack-year history of smoking and currently smoke or have quit within the past 15 years. Fecal occult blood test (FOBT) of the stool. You may have this test every year starting at age 60. Flexible sigmoidoscopy or colonoscopy. You may have a sigmoidoscopy every 5 years or a colonoscopy every 10 years starting at age 56. Hepatitis C blood test. Hepatitis B blood test. Sexually transmitted disease (STD) testing. Diabetes screening. This is done by checking your blood sugar (glucose) after you have not eaten for a while (fasting). You may have this done every 1-3 years. Bone density scan. This is done to screen for osteoporosis. You may have this done starting at age 68. Mammogram. This may be done every 1-2 years. Talk to your health care provider about how often you should have regular mammograms. Talk with your health care provider about your test results, treatment options, and if necessary, the need for more tests. Vaccines  Your health care provider may recommend certain vaccines, such as: Influenza vaccine. This is recommended every year. Tetanus, diphtheria, and acellular pertussis (Tdap, Td) vaccine. You may need a Td booster every 10 years. Zoster vaccine. You may need this after age 39. Pneumococcal 13-valent conjugate (PCV13) vaccine. One dose is recommended after age 25. Pneumococcal  polysaccharide (PPSV23) vaccine. One dose is recommended after age 42. Talk to your health care provider about which screenings and vaccines you need and how often you need them. This information is not intended to replace advice given to you by your health care provider. Make sure you discuss any questions you have with your health care provider. Document Released: 06/11/2015 Document Revised: 02/02/2016 Document Reviewed: 03/16/2015 Elsevier Interactive Patient Education  2017 [place] Prevention in the Home Falls can cause injuries. They can happen to people of all ages. There are many things you can do to make your home safe and to help prevent falls. What can I do on the outside of my home? Regularly fix the edges of walkways and driveways and fix any cracks. Remove anything that might make you trip as you walk through a door, such as a raised step or threshold. Trim any bushes or trees on the path to your home. Use bright outdoor lighting. Clear any walking paths of anything that might make someone trip, such as rocks or tools. Regularly check to see if handrails are loose or broken. Make sure that both sides of any steps have handrails. Any raised decks and porches should have guardrails on the edges. Have any leaves, snow, or ice cleared regularly. Use sand or salt on walking paths during winter. Clean up any spills in your garage right away. This includes oil or grease spills. What can I do in the bathroom? Use night lights. Install grab bars by the toilet and in the tub and shower. Do not use towel bars as grab bars. Use non-skid mats or decals in the tub or shower. If you need to sit down in the shower, use a plastic, non-slip stool. Keep the floor dry. Clean up any water that spills on the floor as soon as it happens. Remove soap buildup in the tub or shower regularly. Attach bath mats securely with double-sided non-slip rug tape. Do not have throw rugs and other  things on the floor that can make you trip. What can I do in the bedroom? Use night lights. Make sure that you have a light by your bed that is easy to reach. Do not use any sheets or blankets that are too big for your bed. They should not hang down onto the floor. Have a firm chair that has side arms. You can use this for support while you get dressed. Do not have throw rugs and other things on the floor that can make you trip. What can I do in the kitchen? Clean up any spills right away. Avoid walking on wet floors. Keep items that you use a lot in easy-to-reach places. If you need to reach something above you, use a strong step stool that has a grab bar. Keep electrical cords out of the way. Do not use floor polish or wax that makes floors slippery. If you must use wax, use non-skid floor wax. Do not have throw rugs and other things on the floor that can make you trip. What can I do with my stairs? Do not leave any items on the stairs. Make sure that there are  handrails on both sides of the stairs and use them. Fix handrails that are broken or loose. Make sure that handrails are as long as the stairways. Check any carpeting to make sure that it is firmly attached to the stairs. Fix any carpet that is loose or worn. Avoid having throw rugs at the top or bottom of the stairs. If you do have throw rugs, attach them to the floor with carpet tape. Make sure that you have a light switch at the top of the stairs and the bottom of the stairs. If you do not have them, ask someone to add them for you. What else can I do to help prevent falls? Wear shoes that: Do not have high heels. Have rubber bottoms. Are comfortable and fit you well. Are closed at the toe. Do not wear sandals. If you use a stepladder: Make sure that it is fully opened. Do not climb a closed stepladder. Make sure that both sides of the stepladder are locked into place. Ask someone to hold it for you, if possible. Clearly  mark and make sure that you can see: Any grab bars or handrails. First and last steps. Where the edge of each step is. Use tools that help you move around (mobility aids) if they are needed. These include: Canes. Walkers. Scooters. Crutches. Turn on the lights when you go into a dark area. Replace any light bulbs as soon as they burn out. Set up your furniture so you have a clear path. Avoid moving your furniture around. If any of your floors are uneven, fix them. If there are any pets around you, be aware of where they are. Review your medicines with your doctor. Some medicines can make you feel dizzy. This can increase your chance of falling. Ask your doctor what other things that you can do to help prevent falls. This information is not intended to replace advice given to you by your health care provider. Make sure you discuss any questions you have with your health care provider. Document Released: 03/11/2009 Document Revised: 10/21/2015 Document Reviewed: 06/19/2014 Elsevier Interactive Patient Education  2017 [employer].

## 2022-03-24 ENCOUNTER — Ambulatory Visit (INDEPENDENT_AMBULATORY_CARE_PROVIDER_SITE_OTHER): Payer: Medicare Other

## 2022-03-24 DIAGNOSIS — Z23 Encounter for immunization: Secondary | ICD-10-CM

## 2022-08-04 ENCOUNTER — Ambulatory Visit: Payer: Medicare Other | Admitting: Adult Health

## 2022-08-15 ENCOUNTER — Encounter: Payer: Self-pay | Admitting: Adult Health

## 2022-08-15 ENCOUNTER — Ambulatory Visit (INDEPENDENT_AMBULATORY_CARE_PROVIDER_SITE_OTHER): Payer: Medicare Other | Admitting: Adult Health

## 2022-08-15 VITALS — BP 110/80 | HR 72 | Temp 97.5°F | Ht 58.5 in | Wt 92.0 lb

## 2022-08-15 DIAGNOSIS — Z0000 Encounter for general adult medical examination without abnormal findings: Secondary | ICD-10-CM

## 2022-08-15 DIAGNOSIS — E079 Disorder of thyroid, unspecified: Secondary | ICD-10-CM | POA: Diagnosis not present

## 2022-08-15 DIAGNOSIS — E782 Mixed hyperlipidemia: Secondary | ICD-10-CM | POA: Diagnosis not present

## 2022-08-15 DIAGNOSIS — E559 Vitamin D deficiency, unspecified: Secondary | ICD-10-CM | POA: Diagnosis not present

## 2022-08-15 DIAGNOSIS — M8589 Other specified disorders of bone density and structure, multiple sites: Secondary | ICD-10-CM | POA: Diagnosis not present

## 2022-08-15 LAB — CBC WITH DIFFERENTIAL/PLATELET
Basophils Absolute: 0 10*3/uL (ref 0.0–0.1)
Basophils Relative: 0.5 % (ref 0.0–3.0)
Eosinophils Absolute: 0.2 10*3/uL (ref 0.0–0.7)
Eosinophils Relative: 6.2 % — ABNORMAL HIGH (ref 0.0–5.0)
HCT: 44.1 % (ref 36.0–46.0)
Hemoglobin: 14.9 g/dL (ref 12.0–15.0)
Lymphocytes Relative: 22.4 % (ref 12.0–46.0)
Lymphs Abs: 0.9 10*3/uL (ref 0.7–4.0)
MCHC: 33.9 g/dL (ref 30.0–36.0)
MCV: 92.2 fl (ref 78.0–100.0)
Monocytes Absolute: 0.5 10*3/uL (ref 0.1–1.0)
Monocytes Relative: 13.6 % — ABNORMAL HIGH (ref 3.0–12.0)
Neutro Abs: 2.2 10*3/uL (ref 1.4–7.7)
Neutrophils Relative %: 57.3 % (ref 43.0–77.0)
Platelets: 127 10*3/uL — ABNORMAL LOW (ref 150.0–400.0)
RBC: 4.78 Mil/uL (ref 3.87–5.11)
RDW: 11.9 % (ref 11.5–15.5)
WBC: 3.8 10*3/uL — ABNORMAL LOW (ref 4.0–10.5)

## 2022-08-15 LAB — LIPID PANEL
Cholesterol: 187 mg/dL (ref 0–200)
HDL: 85 mg/dL (ref >39.00)
LDL Cholesterol: 91 mg/dL (ref 0–99)
NonHDL: 101.82
Total CHOL/HDL Ratio: 2
Triglycerides: 52 mg/dL (ref 0.0–149.0)
VLDL: 10.4 mg/dL (ref 0.0–40.0)

## 2022-08-15 LAB — COMPREHENSIVE METABOLIC PANEL
ALT: 12 U/L (ref 0–35)
AST: 20 U/L (ref 0–37)
Albumin: 4 g/dL (ref 3.5–5.2)
Alkaline Phosphatase: 74 U/L (ref 39–117)
BUN: 14 mg/dL (ref 6–23)
CO2: 27 mEq/L (ref 19–32)
Calcium: 9.6 mg/dL (ref 8.4–10.5)
Chloride: 102 mEq/L (ref 96–112)
Creatinine, Ser: 0.72 mg/dL (ref 0.40–1.20)
GFR: 75.86 mL/min (ref >60.00)
Glucose, Bld: 90 mg/dL (ref 70–99)
Potassium: 4.6 mEq/L (ref 3.5–5.1)
Sodium: 141 mEq/L (ref 135–145)
Total Bilirubin: 0.5 mg/dL (ref 0.2–1.2)
Total Protein: 7.3 g/dL (ref 6.0–8.3)

## 2022-08-15 LAB — TSH: TSH: 4.68 u[IU]/mL (ref 0.35–5.50)

## 2022-08-15 LAB — VITAMIN D 25 HYDROXY (VIT D DEFICIENCY, FRACTURES): VITD: 60.69 ng/mL (ref 30.00–100.00)

## 2022-08-15 NOTE — Patient Instructions (Signed)
It was great seeing you today   We will follow up with you regarding your lab work   Please let me know if you need anything   

## 2022-08-15 NOTE — Progress Notes (Signed)
Subjective:    Patient ID: Jessica Ashley, female    DOB: 08/29/1934, 88 y.o.   MRN: [id number]  HPI Patient presents for yearly preventative medicine examination. She is a pleasant 88 year old female who  has a past medical history of Breast cancer ([facility]) (2011), Cataract, Fuchs' corneal dystrophy, Osteopenia, Personal history of radiation therapy, Shingles, and Thyroid disease.  Osteopenia -last bone density screen in 2018.  Takes vitamin D and calcium.  Refuses follow-up bone density Last vitamin D Lab Results  Component Value Date   VD25OH 50.72 07/01/2021   Macular Degeneration - Is followed by opthamology on a routine basis  History of Thyroid Disease - not currently on medication   All immunizations and health maintenance protocols were reviewed with the patient and needed orders were placed. Refused shingles vaccination.   Hyperlipidemia - has refused statin in the past  Lab Results  Component Value Date   CHOL 202 (H) 12/06/2021   HDL 76.40 12/06/2021   LDLCALC 112 (H) 12/06/2021   LDLDIRECT 132.6 06/04/2009   TRIG 64.0 12/06/2021   CHOLHDL 3 12/06/2021   Appropriate screening laboratory values were ordered for the patient including screening of hyperlipidemia, renal function and hepatic function. If indicated by BPH, a PSA was ordered.  Medication reconciliation,  past medical history, social history, problem list and allergies were reviewed in detail with the patient  Goals were established with regard to weight loss, exercise, and  diet in compliance with medications. She has lost weight intentionally. She cut out junk foods and is eating more a plant based diet with chicken as protein. She is walking an hour a day 7 days a week. .  Wt Readings from Last 3 Encounters:  08/15/22 92 lb (41.7 kg)  07/01/21 114 lb (51.7 kg)  06/03/20 115 lb (52.2 kg)   Review of Systems  Constitutional: Negative.   HENT: Negative.    Eyes: Negative.   Respiratory: Negative.     Cardiovascular: Negative.   Gastrointestinal: Negative.   Endocrine: Negative.   Genitourinary: Negative.   Musculoskeletal: Negative.   Skin: Negative.   Allergic/Immunologic: Negative.   Neurological: Negative.   Hematological: Negative.   Psychiatric/Behavioral: Negative.     Past Medical History:  Diagnosis Date   Breast cancer [facility]) 2011   Cataract    Fuchs' corneal dystrophy    Osteopenia    Personal history of radiation therapy    Shingles    Thyroid disease    thyroid surgery    Social History   Socioeconomic History   Marital status: Married    Spouse name: Not on file   Number of children: Not on file   Years of education: Not on file   Highest education level: Not on file  Occupational History   Not on file  Tobacco Use   Smoking status: Never   Smokeless tobacco: Never  Substance and Sexual Activity   Alcohol use: No    Alcohol/week: 0.0 standard drinks of alcohol   Drug use: No   Sexual activity: Never    Partners: Male    Comment: married  Other Topics Concern   Not on file  Social History Narrative   Retired    Married for 18 years    Two daughters ( both live locally)    Two granddaughters - both at [employer] to read and cross stitch. Likes to garden as well.    Social Determinants of Health  Financial Resource Strain: Low Risk  (01/09/2022)   Overall Financial Resource Strain (CARDIA)    Difficulty of Paying Living Expenses: Not hard at all  Food Insecurity: No Food Insecurity (01/09/2022)   Hunger Vital Sign    Worried About Running Out of Food in the Last Year: Never true    Ran Out of Food in the Last Year: Never true  Transportation Needs: No Transportation Needs (01/09/2022)   PRAPARE - Hydrologist (Medical): No    Lack of Transportation (Non-Medical): No  Physical Activity: Sufficiently Active (01/09/2022)   Exercise Vital Sign    Days of Exercise per Week: 7 days    Minutes of Exercise per  Session: 60 min  Stress: No Stress Concern Present (01/09/2022)   [place]    Feeling of Stress : Not at all  Social Connections: Moderately Integrated (01/09/2022)   Social Connection and Isolation Panel [NHANES]    Frequency of Communication with Friends and Family: More than three times a week    Frequency of Social Gatherings with Friends and Family: More than three times a week    Attends Religious Services: More than 4 times per year    Active Member of [employer] or Organizations: Yes    Attends Archivist Meetings: More than 4 times per year    Marital Status: Widowed  Intimate Partner Violence: Not At Risk (01/09/2022)   Humiliation, Afraid, Rape, and Kick questionnaire    Fear of Current or Ex-Partner: No    Emotionally Abused: No    Physically Abused: No    Sexually Abused: No    Past Surgical History:  Procedure Laterality Date   BREAST BIOPSY Left 06/2015   benign stereotactic    BREAST LUMPECTOMY  2011   CHOLECYSTECTOMY  2011   DILATION AND CURETTAGE, DIAGNOSTIC / THERAPEUTIC  1993   bladder ?   TONSILLECTOMY  1953    Family History  Problem Relation Age of Onset   Cancer Sister 43       bladder   Pneumonia Mother    Heart disease Mother     Allergies  Allergen Reactions   Chlorhexidine Gluconate Swelling and Rash   Elemental Sulfur Swelling    Face and possibly entire body if allowed without intervention.   Dextroamphetamine Sulfate     Per patient she is unaware of reaction to this medication.   Sulfa Antibiotics Swelling   Penicillins Rash    Where injected    Current Outpatient Medications on File Prior to Visit  Medication Sig Dispense Refill   Calcium Carb-Cholecalciferol [phone] MG-UNIT TABS Take 1 tablet by mouth daily.     Cholecalciferol (VITAMIN D) 2000 UNITS CAPS Take by mouth.     dexamethasone (DECADRON) 0.1 % ophthalmic solution Instill 2 drops into both ears  twice daily for 7 days.     sodium chloride (MURO 128) 2 % ophthalmic solution 1 drop.     No current facility-administered medications on file prior to visit.    BP 110/80   Pulse 72   Temp (!) 97.5 F (36.4 C) (Oral)   Ht 4' 10.5" (1.486 m)   Wt 92 lb (41.7 kg)   SpO2 94%   BMI 18.90 kg/m       Objective:   Physical Exam Vitals and nursing note reviewed.  Constitutional:      General: She is not in acute distress.    Appearance:  Normal appearance. She is not ill-appearing.  HENT:     Head: Normocephalic and atraumatic.     Right Ear: Tympanic membrane, ear canal and external ear normal. There is no impacted cerumen.     Left Ear: Tympanic membrane, ear canal and external ear normal. There is no impacted cerumen.     Nose: Nose normal. No congestion or rhinorrhea.     Mouth/Throat:     Mouth: Mucous membranes are moist.     Pharynx: Oropharynx is clear.  Eyes:     Extraocular Movements: Extraocular movements intact.     Conjunctiva/sclera: Conjunctivae normal.     Pupils: Pupils are equal, round, and reactive to light.  Neck:     Vascular: No carotid bruit.  Cardiovascular:     Rate and Rhythm: Normal rate and regular rhythm.     Pulses: Normal pulses.     Heart sounds: No murmur heard.    No friction rub. No gallop.  Pulmonary:     Effort: Pulmonary effort is normal.     Breath sounds: Normal breath sounds.  Abdominal:     General: Abdomen is flat. Bowel sounds are normal. There is no distension.     Palpations: Abdomen is soft. There is no mass.     Tenderness: There is no abdominal tenderness. There is no guarding or rebound.     Hernia: No hernia is present.  Musculoskeletal:        General: Normal range of motion.     Cervical back: Normal range of motion and neck supple.  Lymphadenopathy:     Cervical: No cervical adenopathy.  Skin:    General: Skin is warm and dry.     Capillary Refill: Capillary refill takes less than 2 seconds.  Neurological:      General: No focal deficit present.     Mental Status: She is alert and oriented to person, place, and time.  Psychiatric:        Mood and Affect: Mood normal.        Behavior: Behavior normal.        Thought Content: Thought content normal.        Judgment: Judgment normal.       Assessment & Plan:  1. Routine general medical examination at a health care facility Today patient counseled on age appropriate routine health concerns for screening and prevention, each reviewed and up to date or declined. Immunizations reviewed and up to date or declined. Labs ordered and reviewed. Risk factors for depression reviewed and negative. Hearing function and visual acuity are intact. ADLs screened and addressed as needed. Functional ability and level of safety reviewed and appropriate. Education, counseling and referrals performed based on assessed risks today. Patient provided with a copy of personalized plan for preventive services. - Encouraged protein shake to help get daily protein intake  - Follow up in one year or sooner if needed  2. Vitamin D deficiency  - VITAMIN D 25 Hydroxy (Vit-D Deficiency, Fractures); Future  3. Osteopenia of multiple sites  - VITAMIN D 25 Hydroxy (Vit-D Deficiency, Fractures); Future  4. Thyroid disease  - TSH; Future  5. Mixed hyperlipidemia - Consider statin  - CBC with Differential/Platelet; Future - Comprehensive metabolic panel; Future - Lipid panel; Future - TSH; Future  Dorothyann Peng, NP

## 2022-08-23 DIAGNOSIS — Z961 Presence of intraocular lens: Secondary | ICD-10-CM | POA: Diagnosis not present

## 2022-08-23 DIAGNOSIS — H52203 Unspecified astigmatism, bilateral: Secondary | ICD-10-CM | POA: Diagnosis not present

## 2022-08-23 DIAGNOSIS — H18513 Endothelial corneal dystrophy, bilateral: Secondary | ICD-10-CM | POA: Diagnosis not present

## 2022-12-27 ENCOUNTER — Encounter (INDEPENDENT_AMBULATORY_CARE_PROVIDER_SITE_OTHER): Payer: Self-pay

## 2023-03-06 DIAGNOSIS — H6123 Impacted cerumen, bilateral: Secondary | ICD-10-CM | POA: Diagnosis not present

## 2023-08-22 DIAGNOSIS — Z961 Presence of intraocular lens: Secondary | ICD-10-CM | POA: Diagnosis not present

## 2023-08-22 DIAGNOSIS — H52203 Unspecified astigmatism, bilateral: Secondary | ICD-10-CM | POA: Diagnosis not present

## 2023-08-22 DIAGNOSIS — H18513 Endothelial corneal dystrophy, bilateral: Secondary | ICD-10-CM | POA: Diagnosis not present

## 2023-11-28 DIAGNOSIS — R300 Dysuria: Secondary | ICD-10-CM | POA: Diagnosis not present

## 2023-11-28 DIAGNOSIS — G4489 Other headache syndrome: Secondary | ICD-10-CM | POA: Diagnosis not present

## 2023-11-28 DIAGNOSIS — Z20822 Contact with and (suspected) exposure to covid-19: Secondary | ICD-10-CM | POA: Diagnosis not present

## 2023-11-28 DIAGNOSIS — M25551 Pain in right hip: Secondary | ICD-10-CM | POA: Diagnosis not present

## 2023-11-28 DIAGNOSIS — M25511 Pain in right shoulder: Secondary | ICD-10-CM | POA: Diagnosis not present

## 2023-11-28 DIAGNOSIS — R82998 Other abnormal findings in urine: Secondary | ICD-10-CM | POA: Diagnosis not present

## 2024-08-18 ENCOUNTER — Ambulatory Visit
